# Patient Record
Sex: Female | Born: 1960 | Race: Black or African American | Hispanic: No | Marital: Single | Smoking: Never smoker
Health system: Southern US, Community
[De-identification: ages and names within clinical notes are randomized; demographics above are authoritative.]

## PROBLEM LIST (undated history)

## (undated) DIAGNOSIS — R7303 Prediabetes: Secondary | ICD-10-CM

## (undated) DIAGNOSIS — M169 Osteoarthritis of hip, unspecified: Secondary | ICD-10-CM

## (undated) HISTORY — DX: Prediabetes: R73.03

## (undated) HISTORY — DX: Osteoarthritis of hip, unspecified: M16.9

---

## 1998-04-15 ENCOUNTER — Other Ambulatory Visit: Admission: RE | Admit: 1998-04-15 | Discharge: 1998-04-15 | Payer: Self-pay | Admitting: *Deleted

## 1999-07-05 ENCOUNTER — Other Ambulatory Visit: Admission: RE | Admit: 1999-07-05 | Discharge: 1999-07-05 | Payer: Self-pay | Admitting: *Deleted

## 2000-08-23 ENCOUNTER — Other Ambulatory Visit: Admission: RE | Admit: 2000-08-23 | Discharge: 2000-08-23 | Payer: Self-pay | Admitting: *Deleted

## 2003-10-07 ENCOUNTER — Encounter: Admission: RE | Admit: 2003-10-07 | Discharge: 2003-10-07 | Payer: Self-pay | Admitting: Obstetrics and Gynecology

## 2004-11-29 ENCOUNTER — Encounter: Admission: RE | Admit: 2004-11-29 | Discharge: 2004-11-29 | Payer: Self-pay | Admitting: Obstetrics and Gynecology

## 2005-11-30 ENCOUNTER — Encounter: Admission: RE | Admit: 2005-11-30 | Discharge: 2005-11-30 | Payer: Self-pay | Admitting: Obstetrics and Gynecology

## 2006-12-05 ENCOUNTER — Encounter: Admission: RE | Admit: 2006-12-05 | Discharge: 2006-12-05 | Payer: Self-pay | Admitting: Obstetrics and Gynecology

## 2007-12-06 ENCOUNTER — Encounter: Admission: RE | Admit: 2007-12-06 | Discharge: 2007-12-06 | Payer: Self-pay | Admitting: Obstetrics and Gynecology

## 2008-12-07 ENCOUNTER — Encounter: Admission: RE | Admit: 2008-12-07 | Discharge: 2008-12-07 | Payer: Self-pay | Admitting: Obstetrics and Gynecology

## 2009-12-08 ENCOUNTER — Encounter
Admission: RE | Admit: 2009-12-08 | Discharge: 2009-12-08 | Payer: Self-pay | Source: Home / Self Care | Admitting: Obstetrics and Gynecology

## 2010-11-08 ENCOUNTER — Other Ambulatory Visit: Payer: Self-pay | Admitting: Obstetrics and Gynecology

## 2010-11-08 DIAGNOSIS — Z1231 Encounter for screening mammogram for malignant neoplasm of breast: Secondary | ICD-10-CM

## 2010-12-12 ENCOUNTER — Ambulatory Visit
Admission: RE | Admit: 2010-12-12 | Discharge: 2010-12-12 | Disposition: A | Discharge status: Home / Self Care | Payer: 59 | Source: Ambulatory Visit | Attending: Obstetrics and Gynecology | Admitting: Obstetrics and Gynecology

## 2010-12-12 DIAGNOSIS — Z1231 Encounter for screening mammogram for malignant neoplasm of breast: Secondary | ICD-10-CM

## 2011-09-27 ENCOUNTER — Other Ambulatory Visit: Payer: Self-pay | Admitting: Obstetrics and Gynecology

## 2011-09-27 DIAGNOSIS — Z1231 Encounter for screening mammogram for malignant neoplasm of breast: Secondary | ICD-10-CM

## 2011-12-15 ENCOUNTER — Ambulatory Visit: Payer: 59

## 2011-12-15 ENCOUNTER — Ambulatory Visit
Admission: RE | Admit: 2011-12-15 | Discharge: 2011-12-15 | Disposition: A | Discharge status: Home / Self Care | Payer: 59 | Source: Ambulatory Visit | Attending: Obstetrics and Gynecology | Admitting: Obstetrics and Gynecology

## 2011-12-15 DIAGNOSIS — Z1231 Encounter for screening mammogram for malignant neoplasm of breast: Secondary | ICD-10-CM

## 2012-11-07 ENCOUNTER — Other Ambulatory Visit: Payer: Self-pay

## 2012-11-07 DIAGNOSIS — Z1231 Encounter for screening mammogram for malignant neoplasm of breast: Secondary | ICD-10-CM

## 2012-12-16 ENCOUNTER — Ambulatory Visit: Payer: 59

## 2012-12-27 ENCOUNTER — Ambulatory Visit: Payer: 59

## 2012-12-31 ENCOUNTER — Ambulatory Visit
Admission: RE | Admit: 2012-12-31 | Discharge: 2012-12-31 | Disposition: A | Discharge status: Home / Self Care | Payer: 59 | Source: Ambulatory Visit

## 2012-12-31 DIAGNOSIS — Z1231 Encounter for screening mammogram for malignant neoplasm of breast: Secondary | ICD-10-CM

## 2013-11-09 ENCOUNTER — Ambulatory Visit (INDEPENDENT_AMBULATORY_CARE_PROVIDER_SITE_OTHER): Payer: 59

## 2013-11-09 ENCOUNTER — Ambulatory Visit (INDEPENDENT_AMBULATORY_CARE_PROVIDER_SITE_OTHER): Payer: 59 | Admitting: Family Medicine

## 2013-11-09 VITALS — BP 134/78 | HR 67 | Temp 98.4°F | Resp 16 | Ht 64.0 in | Wt 168.0 lb

## 2013-11-09 DIAGNOSIS — M25551 Pain in right hip: Secondary | ICD-10-CM

## 2013-11-09 DIAGNOSIS — M545 Low back pain: Secondary | ICD-10-CM

## 2013-11-09 DIAGNOSIS — M1611 Unilateral primary osteoarthritis, right hip: Secondary | ICD-10-CM

## 2013-11-09 MED ORDER — MELOXICAM 7.5 MG PO TABS: 7.5000 mg | ORAL_TABLET | Freq: Every day | ORAL | Status: DC

## 2013-11-09 NOTE — Patient Instructions (Signed)
Try the exercises and other information in the back care manual, meloxicam once or twice during the day as needed (avoid taking other NSAIDS like Alleve or Ibuprofen while taking this). I will refer you to orthopaedic doctor as discussed. Return to the clinic or go to the nearest emergency room if any of your symptoms worsen or new symptoms occur.  Hip Pain Your hip is the joint between your upper legs and your lower pelvis. The bones, cartilage, tendons, and muscles of your hip joint perform a lot of work each day supporting your body weight and allowing you to move around. Hip pain can range from a minor ache to severe pain in one or both of your hips. Pain may be felt on the inside of the hip joint near the groin, or the outside near the buttocks and upper thigh. You may have swelling or stiffness as well.  HOME CARE INSTRUCTIONS   Take medicines only as directed by your health care provider.  Apply ice to the injured area:  Put ice in a plastic bag.  Place a towel between your skin and the bag.  Leave the ice on for 15-20 minutes at a time, 3-4 times a day.  Keep your leg raised (elevated) when possible to lessen swelling.  Avoid activities that cause pain.  Follow specific exercises as directed by your health care provider.  Sleep with a pillow between your legs on your most comfortable side.  Record how often you have hip pain, the location of the pain, and what it feels like. SEEK MEDICAL CARE IF:   You are unable to put weight on your leg.  Your hip is red or swollen or very tender to touch.  Your pain or swelling continues or worsens after 1 week.  You have increasing difficulty walking.  You have a fever. SEEK IMMEDIATE MEDICAL CARE IF:   You have fallen.  You have a sudden increase in pain and swelling in your hip. MAKE SURE YOU:   Understand these instructions.  Will watch your condition.  Will get help right away if you are not doing well or get  worse. Document Released: 06/08/2009 Document Revised: 05/05/2013 Document Reviewed: 08/15/2012 [facility] Patient Information 2015 [place], [place]. This information is not intended to replace advice given to you by your health care provider. Make sure you discuss any questions you have with your health care provider.

## 2013-11-09 NOTE — Progress Notes (Addendum)
Subjective:    Patient ID: Linda Wagner, female    DOB: [birth year], 53 y.o.   MRN: [id number]  HPI Chief Complaint  Patient presents with  . Leg Pain    right   This chart was scribed for Ferry Matthis, MD by Andrew Au, ED Scribe. This patient was seen in room 12 and the patient's care was started at 11:27 AM.  HPI Comments: Linda Wagner is a 53 y.o. female who presents to the [facility] complaining of right leg pain that began few months ago. Pt reports pain was initially intermittent but reports worsening more frequent intermittent pain within the last week. She reports pain with bearing weight, some tingling to right leg, and weakness where her right leg given out at times. She reports associated intermittent, non radiating low back pain that she uses a heating pad for as neded. Pt has taken aleve for pain as need with minimal relief to leg pain. Pt reports more exercise this week with walking and workout machinery early this week. Pt denies bladder and bowel incont  There are no active problems to display for this patient.  History reviewed. No pertinent past medical history. History reviewed. No pertinent past surgical history. No Known Allergies Prior to Admission medications   Not on File   History   Social History  . Marital Status: Single    Spouse Name: N/A    Number of Children: N/A  . Years of Education: N/A   Occupational History  . Not on file.   Social History Main Topics  . Smoking status: Never Smoker   . Smokeless tobacco: Not on file  . Alcohol Use: Not on file  . Drug Use: Not on file  . Sexual Activity: Not on file   Other Topics Concern  . Not on file   Social History Narrative  . No narrative on file   Review of Systems  Musculoskeletal: Positive for myalgias and arthralgias.  Neurological: Positive for weakness.   Objective:   Physical Exam  Constitutional: She is oriented to person, place, and time. She  appears well-developed and well-nourished. No distress.  HENT:  Head: Normocephalic and atraumatic.  Eyes: Conjunctivae and EOM are normal.  Neck: Neck supple.  Cardiovascular: Normal rate.   Pulmonary/Chest: Effort normal.  Musculoskeletal: Normal range of motion.  L-spine- Full ROM with slight tenderness. She is able to heal- toe without difficulty . Right hip- Crepitance and discomfort with external and internal rotation. positive C sign with description of area of discomfort at right hip. Slight decrease internal and external rotation with reproduction of pain with both. Positive FADDIR positive FABER    Neurological: She is alert and oriented to person, place, and time. No cranial nerve deficit. She displays no Babinski's sign on the right side. She displays no Babinski's sign on the left side.  Reflex Scores:      Patellar reflexes are 2+ on the right side and 2+ on the left side.      Achilles reflexes are 2+ on the right side and 2+ on the left side. Skin: Skin is warm and dry.  Psychiatric: She has a normal mood and affect. Her behavior is normal.  Nursing note and vitals reviewed.  Filed Vitals:   11/09/13 1101  BP: 134/78  Pulse: 67  Temp: 98.4 F (36.9 C)  TempSrc: Oral  Resp: 16  Height: 5\' 4"  (1.626 m)  Weight: 168 lb (76.204 kg)  SpO2: 99%  [facility] reading (PRIMARY) by Dr. Neva SeatGreene. Right hip- Degenerative changes with bony prominence on superior acetabulum and questionable cam lesion or bony prominence on femoral head. LS-spine- no acute findings. Slight decrease space between L1-L2      Assessment & Plan:   Linda Wagner is a 53 y.o. female Right hip pain - Plan: DG Hip Complete Right, meloxicam (MOBIC) 7.5 MG tablet, Ambulatory referral to Orthopedic Surgery  Low back pain, unspecified back pain laterality, with sciatica presence unspecified - Plan: DG Lumbar Spine 2-3 Views, meloxicam (MOBIC) 7.5 MG tablet  Osteoarthritis of right hip, unspecified  osteoarthritis type - Plan: meloxicam (MOBIC) 7.5 MG tablet, Ambulatory referral to Orthopedic Surgery  R hip pain - probable OA, but DDX of femoral acetabular impingement/FAI. Some DDD of LS spine, but does not appear to be primary cause of hip symptoms. Can start with Mobic and refer to ortho.  XR of hip report noted: . Degenerative osteoarthritis of the right hip including buttressing of the medial aspect of the femoral neck, narrowing of the superolateral joint space and early osteophyte formation at the lateral acetabulum. 2. No acute osseous abnormality or malalignment  Meds ordered this encounter  Medications  . meloxicam (MOBIC) 7.5 MG tablet    Sig: Take 1 tablet (7.5 mg total) by mouth daily.    Dispense:  30 tablet    Refill:  0   Patient Instructions  Try the exercises and other information in the back care manual, meloxicam once or twice during the day as needed (avoid taking other NSAIDS like Alleve or Ibuprofen while taking this). I will refer you to orthopaedic doctor as discussed. Return to the clinic or go to the nearest emergency room if any of your symptoms worsen or new symptoms occur.  Hip Pain Your hip is the joint between your upper legs and your lower pelvis. The bones, cartilage, tendons, and muscles of your hip joint perform a lot of work each day supporting your body weight and allowing you to move around. Hip pain can range from a minor ache to severe pain in one or both of your hips. Pain may be felt on the inside of the hip joint near the groin, or the outside near the buttocks and upper thigh. You may have swelling or stiffness as well.  HOME CARE INSTRUCTIONS   Take medicines only as directed by your health care provider.  Apply ice to the injured area:  Put ice in a plastic bag.  Place a towel between your skin and the bag.  Leave the ice on for 15-20 minutes at a time, 3-4 times a day.  Keep your leg raised (elevated) when possible to lessen  swelling.  Avoid activities that cause pain.  Follow specific exercises as directed by your health care provider.  Sleep with a pillow between your legs on your most comfortable side.  Record how often you have hip pain, the location of the pain, and what it feels like. SEEK MEDICAL CARE IF:   You are unable to put weight on your leg.  Your hip is red or swollen or very tender to touch.  Your pain or swelling continues or worsens after 1 week.  You have increasing difficulty walking.  You have a fever. SEEK IMMEDIATE MEDICAL CARE IF:   You have fallen.  You have a sudden increase in pain and swelling in your hip. MAKE SURE YOU:   Understand these instructions.  Will watch your condition.  Will get help  right away if you are not doing well or get worse. Document Released: 06/08/2009 Document Revised: 05/05/2013 Document Reviewed: 08/15/2012 [facility] Patient Information 2015 [place], [place]. This information is not intended to replace advice given to you by your health care provider. Make sure you discuss any questions you have with your health care provider.       I personally performed the services described in this documentation, which was scribed in my presence. The recorded information has been reviewed and considered, and addended by me as needed.

## 2014-01-14 ENCOUNTER — Other Ambulatory Visit: Payer: Self-pay

## 2014-01-14 DIAGNOSIS — Z1231 Encounter for screening mammogram for malignant neoplasm of breast: Secondary | ICD-10-CM

## 2014-01-16 ENCOUNTER — Ambulatory Visit
Admission: RE | Admit: 2014-01-16 | Discharge: 2014-01-16 | Disposition: A | Discharge status: Home / Self Care | Payer: BLUE CROSS/BLUE SHIELD | Source: Ambulatory Visit

## 2014-01-16 DIAGNOSIS — Z1231 Encounter for screening mammogram for malignant neoplasm of breast: Secondary | ICD-10-CM

## 2015-01-06 ENCOUNTER — Other Ambulatory Visit: Payer: Self-pay

## 2015-01-06 DIAGNOSIS — Z1231 Encounter for screening mammogram for malignant neoplasm of breast: Secondary | ICD-10-CM

## 2015-01-19 ENCOUNTER — Ambulatory Visit
Admission: RE | Admit: 2015-01-19 | Discharge: 2015-01-19 | Disposition: A | Discharge status: Home / Self Care | Payer: BLUE CROSS/BLUE SHIELD | Source: Ambulatory Visit

## 2015-01-19 DIAGNOSIS — Z1231 Encounter for screening mammogram for malignant neoplasm of breast: Secondary | ICD-10-CM

## 2015-12-15 IMAGING — CR DG HIP COMPLETE 2+V*R*
2 series · 2 of 2 positions shown · non-contrast
Comparison: Concurrently obtained radiographs of the lumbar spine.

CLINICAL DATA: 53-year-old female with several month history
intermittent right leg pain, weakness and tingling.

EXAM:
RIGHT HIP - COMPLETE 2+ VIEW

[AP]
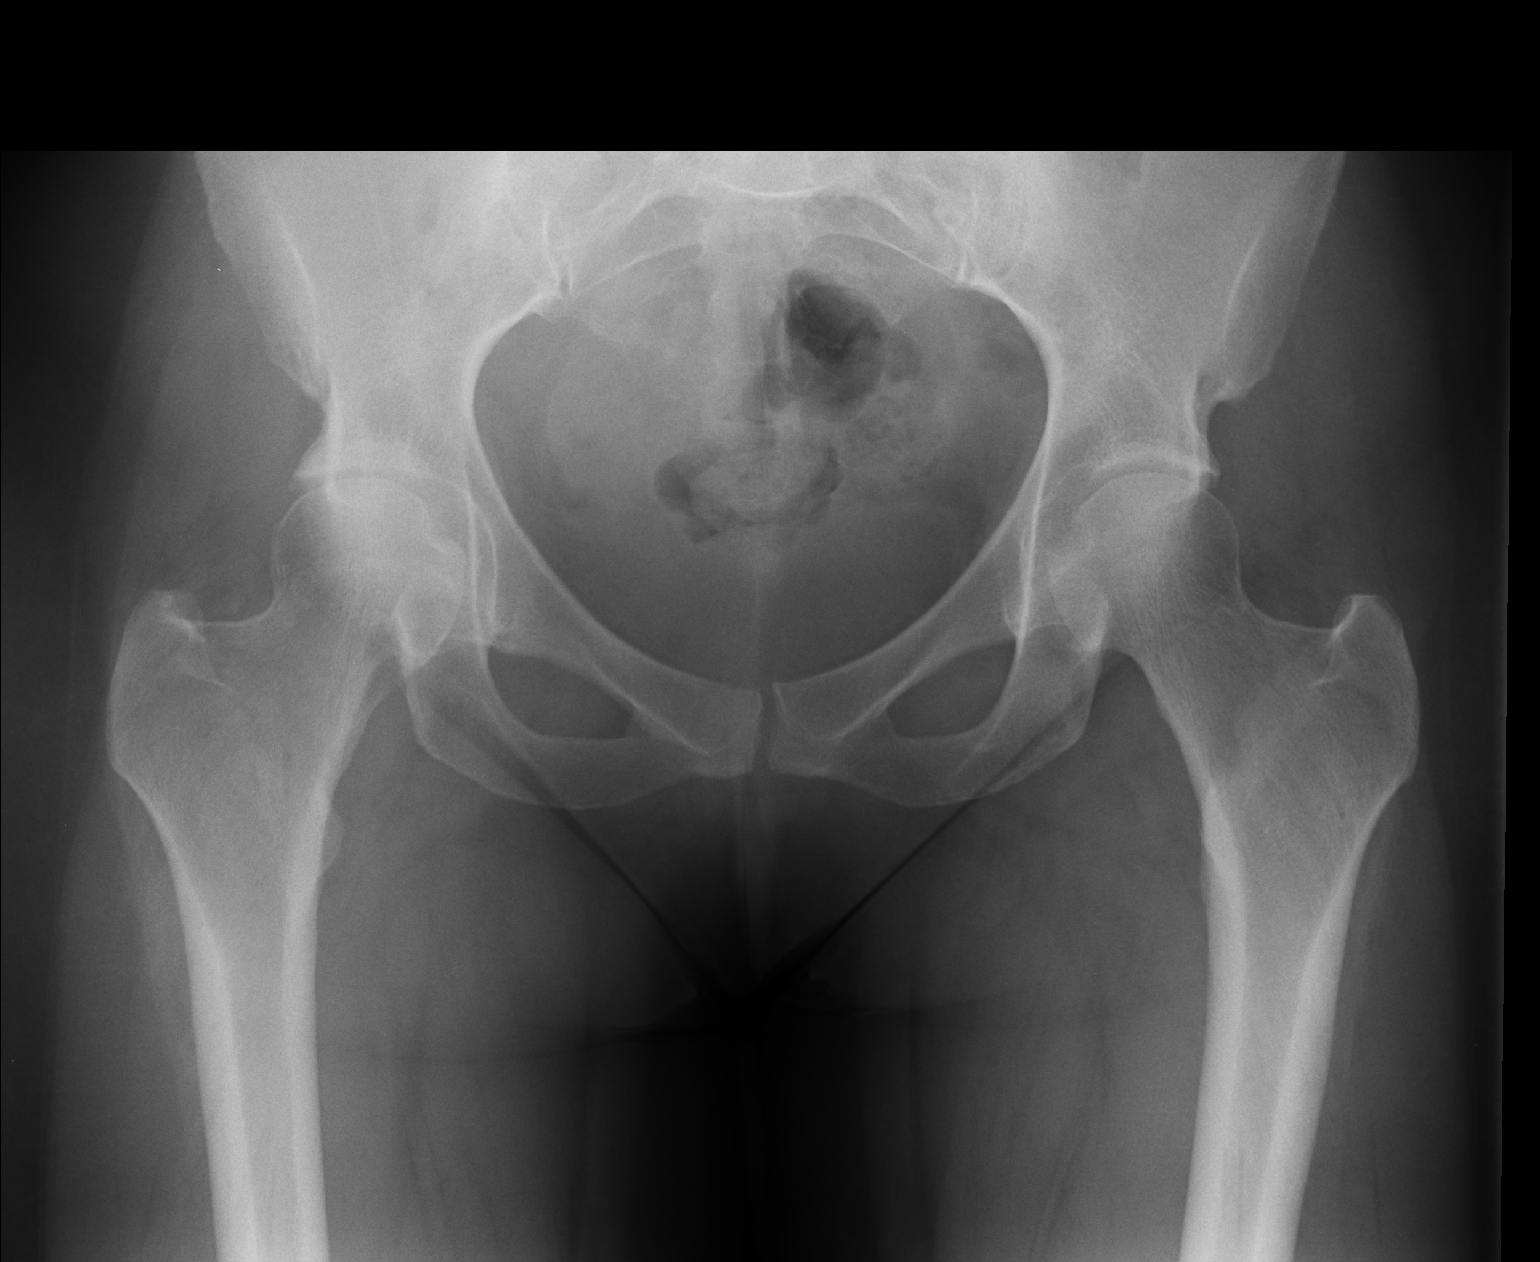

[lateral]
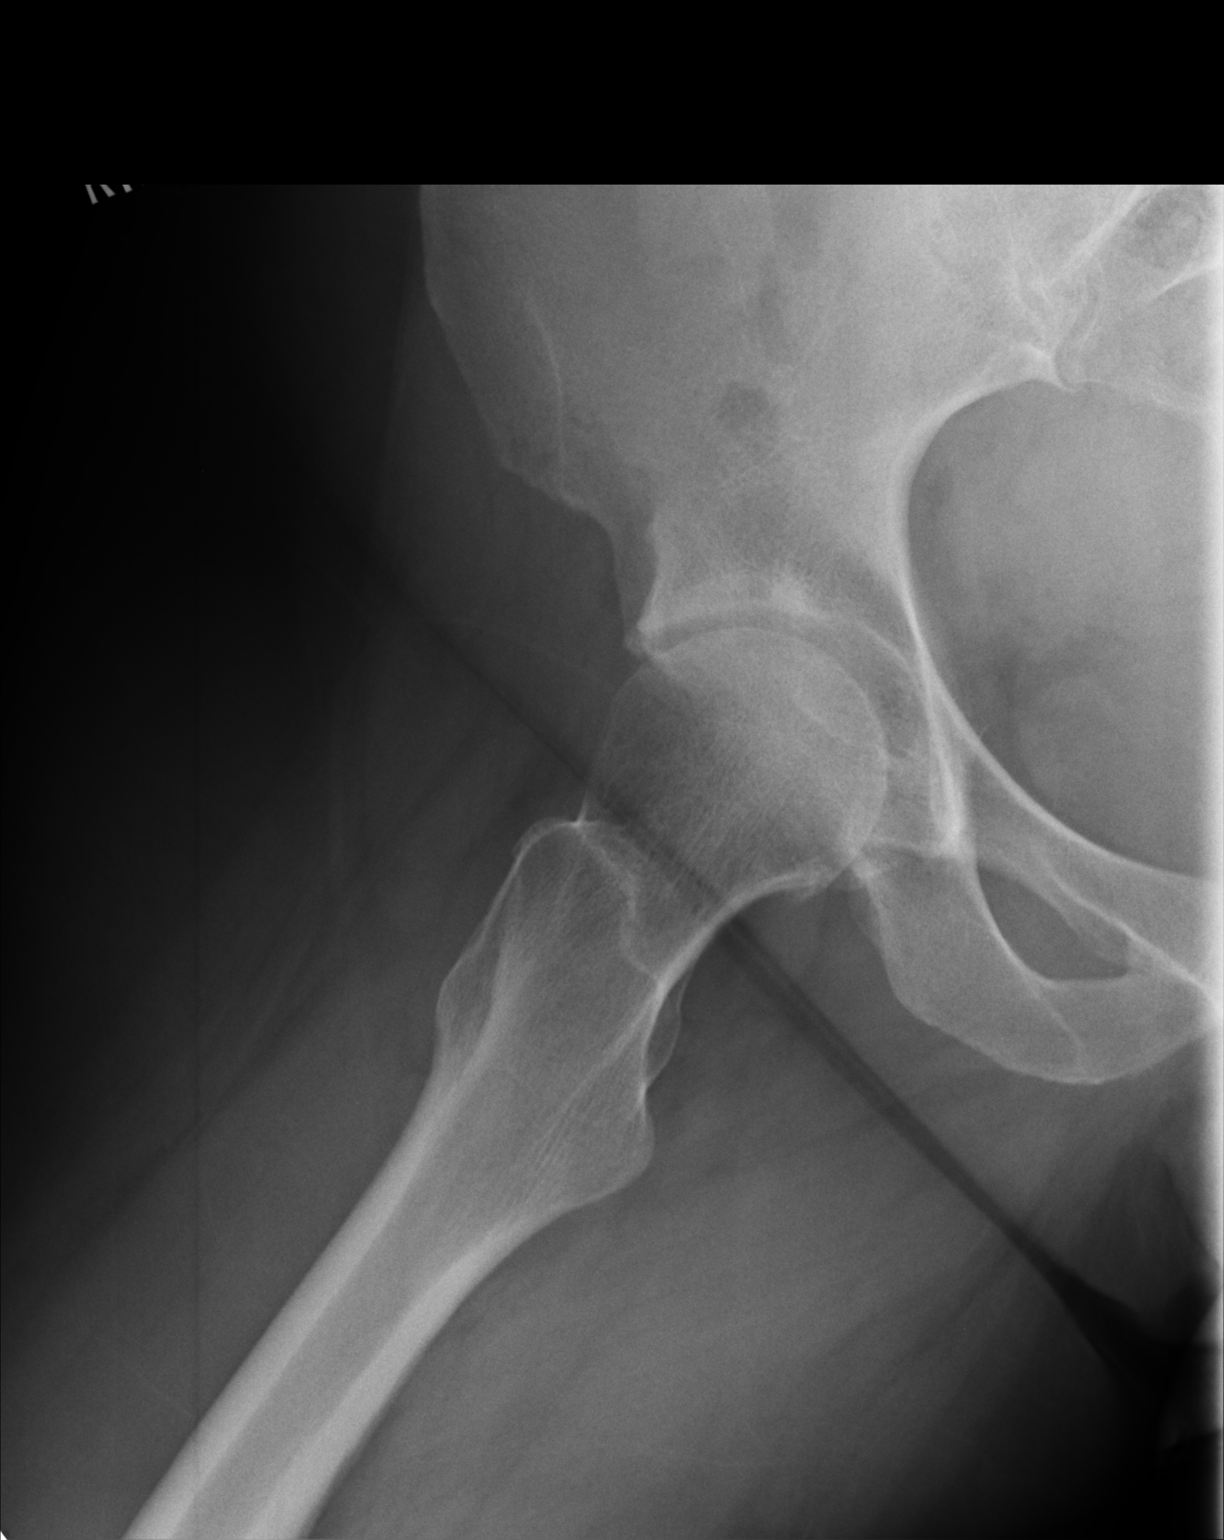

[2 of 2 positions shown; findings below may reference images not displayed]

FINDINGS: Mild degenerative osteoarthritis of the right hip joint with
narrowing of the superolateral joint space, early sclerosis an
osteophyte formation at the lateral acetabulum. Additionally, there
is slight buttressing of the medial femoral neck on the frontal view
which is not evident on the lateral view. The visualized bony pelvis
is intact and unremarkable. The left hip is within normal limits.
IMPRESSION: 1. Degenerative osteoarthritis of the right hip including
buttressing of the medial aspect of the femoral neck, narrowing of
the superolateral joint space and early osteophyte formation at the
lateral acetabulum.
2. No acute osseous abnormality or malalignment.

## 2016-10-31 LAB — LIPID PANEL
Cholesterol: 206 — AB (ref 0–200)
HDL: 70 (ref 35–70)
LDL Cholesterol: 124
LDl/HDL Ratio: 1.8
Triglycerides: 60 (ref 40–160)

## 2016-10-31 LAB — BASIC METABOLIC PANEL
BUN: 13 (ref 4–21)
Creatinine: 0.9 (ref 0.5–1.1)
Glucose: 76
Potassium: 4 (ref 3.4–5.3)
Sodium: 134 — AB (ref 137–147)

## 2016-10-31 LAB — HEPATIC FUNCTION PANEL
ALT: 18 (ref 7–35)
AST: 32 (ref 13–35)
Alkaline Phosphatase: 86 (ref 25–125)
Bilirubin, Total: 0.6

## 2016-10-31 LAB — VITAMIN D 25 HYDROXY (VIT D DEFICIENCY, FRACTURES): Vit D, 25-Hydroxy: 53.5

## 2016-10-31 LAB — CBC AND DIFFERENTIAL
HCT: 38 (ref 36–46)
Hemoglobin: 12.6 (ref 12.0–16.0)
Platelets: 206 (ref 150–399)
WBC: 5.1

## 2016-10-31 LAB — HEMOGLOBIN A1C: Hemoglobin A1C: 6.8

## 2017-02-23 IMAGING — MG MM SCREENING BREAST TOMO BILATERAL
8 series · 8 of 24 positions shown · non-contrast
Comparison: Previous exam(s).

CLINICAL DATA: Screening.

EXAM:
DIGITAL SCREENING BILATERAL MAMMOGRAM WITH 3D TOMO WITH CAD

[L CC]
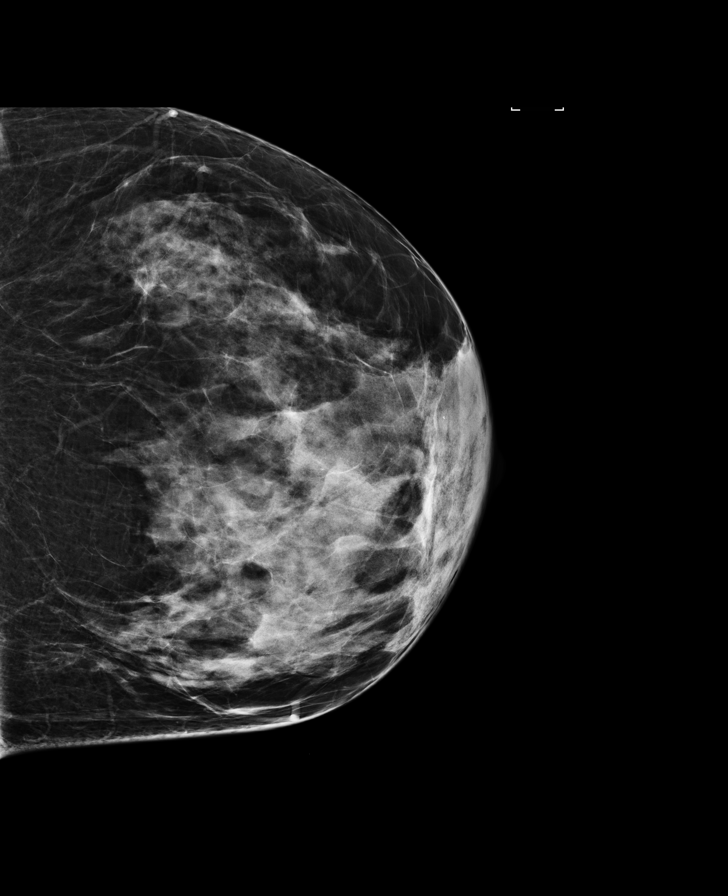

[R MLO]
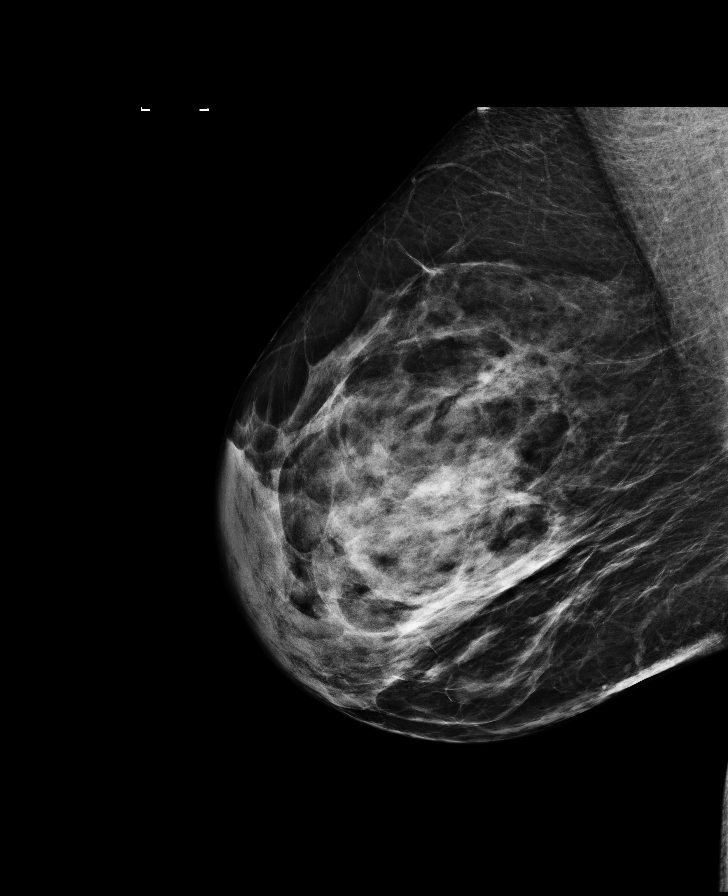

[L MLO]
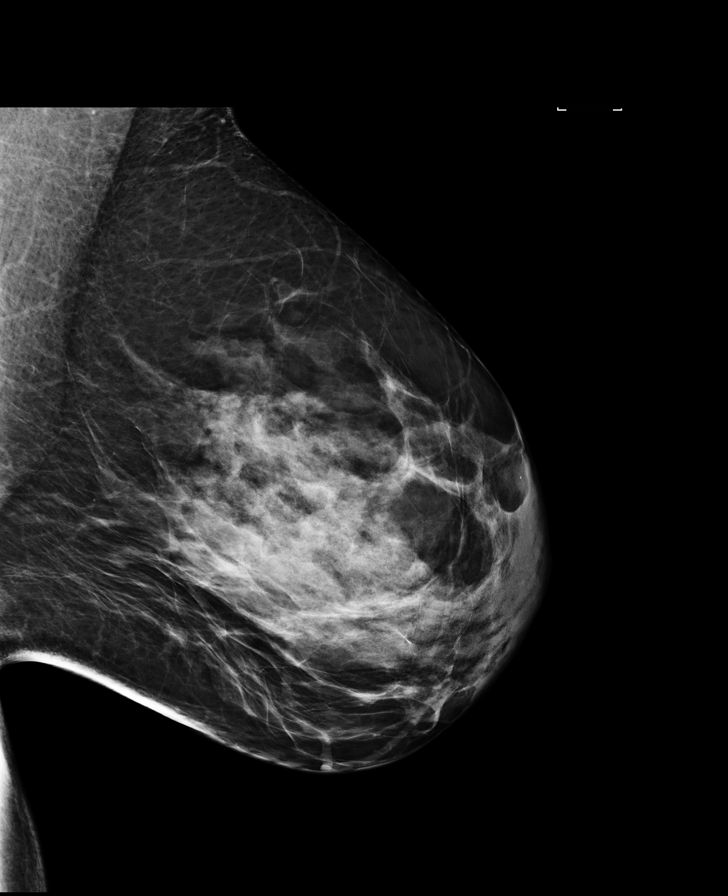

[R CC]
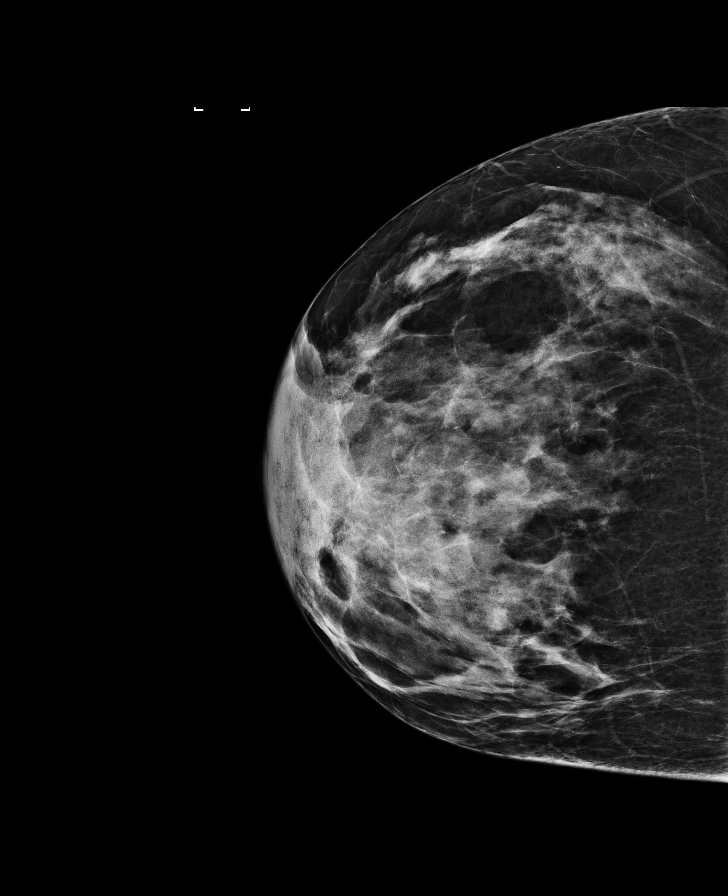

[L CC tomo · tomo slice 37/74.0]
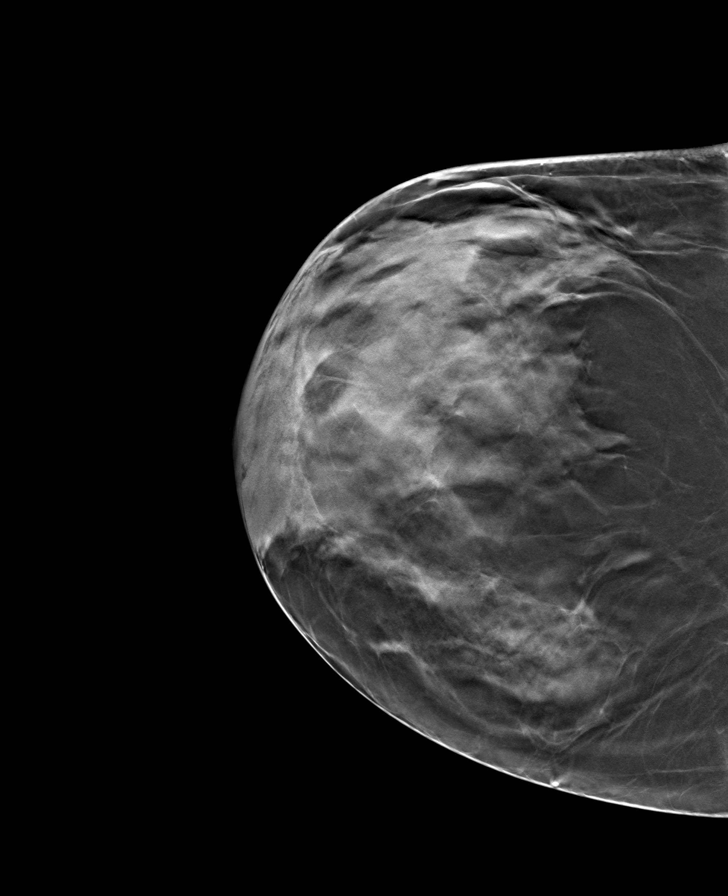

[R CC tomo · tomo slice 38/75.0]
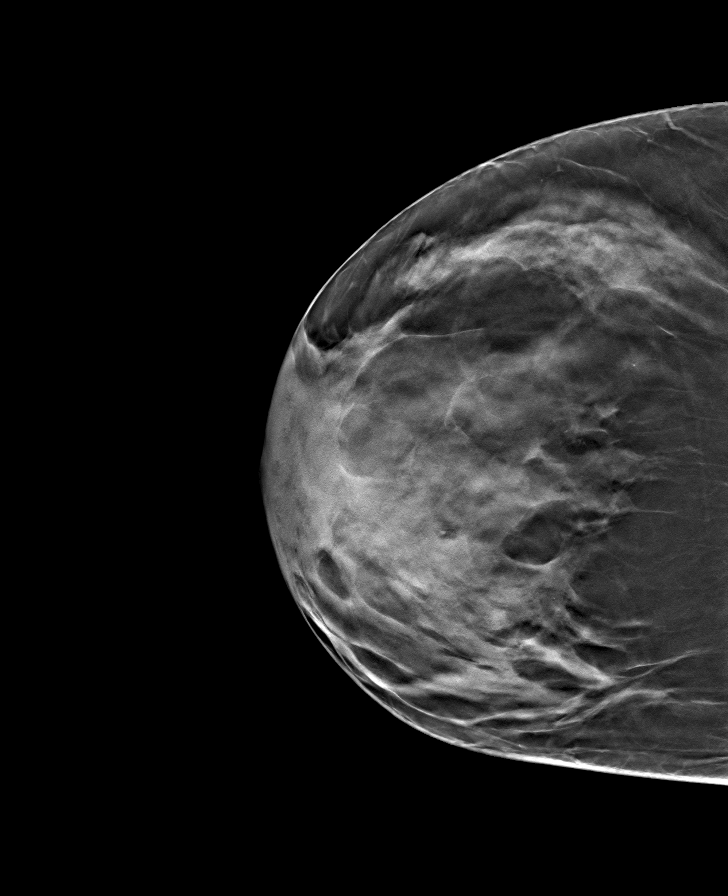

[L MLO tomo · tomo slice 45/90.0]
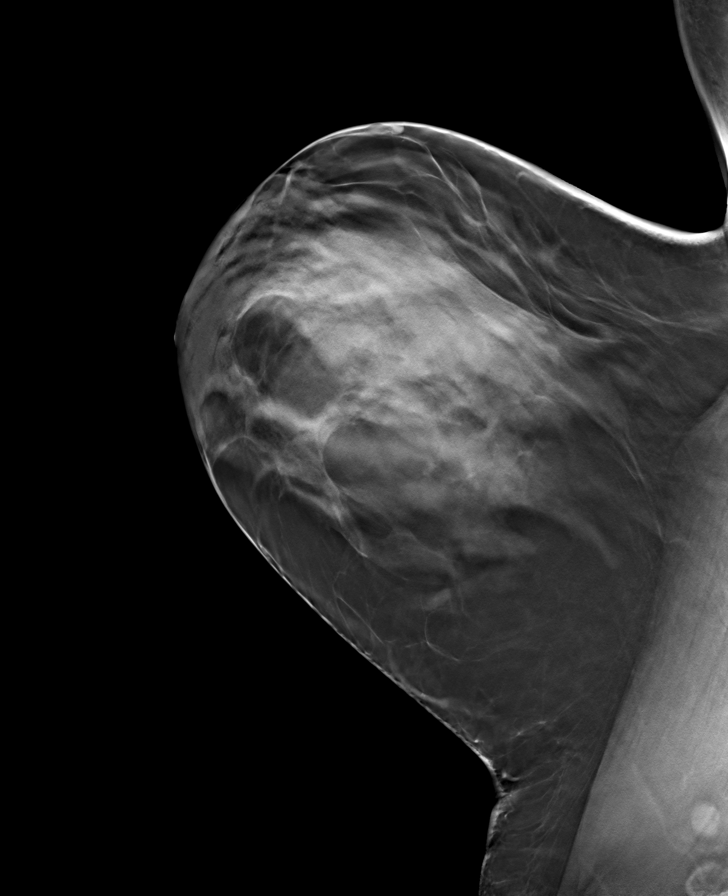

[R MLO tomo · tomo slice 45/89.0]
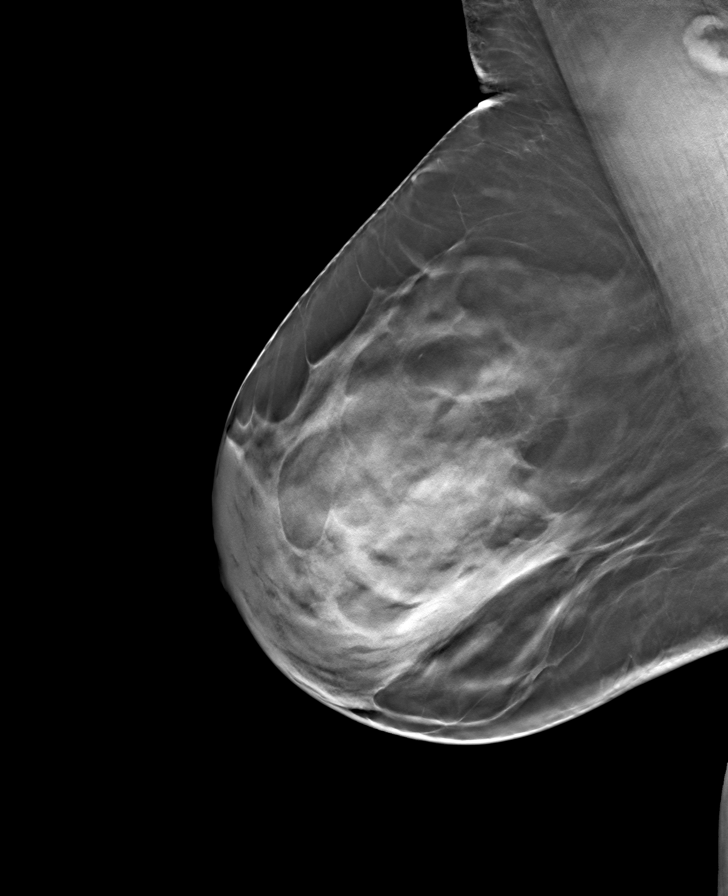

[8 of 24 positions shown; findings below may reference images not displayed]

ACR Breast Density Category c: The breast tissue is heterogeneously
dense, which may obscure small masses.
FINDINGS: There are no findings suspicious for malignancy. Images were
processed with CAD.
IMPRESSION: No mammographic evidence of malignancy. A result letter of this
screening mammogram will be mailed directly to the patient.

RECOMMENDATION:
Screening mammogram in one year. (Code:OA-G-1SS)

BI-RADS CATEGORY  1: Negative.

## 2017-05-02 LAB — HEMOGLOBIN A1C: Hemoglobin A1C: 6.8

## 2017-05-02 LAB — BASIC METABOLIC PANEL
BUN: 16 (ref 4–21)
Creatinine: 0.8 (ref 0.5–1.1)
Glucose: 90
Potassium: 4.7 (ref 3.4–5.3)
Sodium: 139 (ref 137–147)

## 2017-09-27 LAB — HM DIABETES EYE EXAM

## 2017-12-13 ENCOUNTER — Ambulatory Visit (INDEPENDENT_AMBULATORY_CARE_PROVIDER_SITE_OTHER): Payer: BLUE CROSS/BLUE SHIELD | Admitting: Internal Medicine

## 2017-12-13 ENCOUNTER — Encounter: Payer: Self-pay | Admitting: Internal Medicine

## 2017-12-13 VITALS — BP 122/86 | HR 46 | Temp 98.7°F | Ht 61.5 in | Wt 147.8 lb

## 2017-12-13 DIAGNOSIS — Z0000 Encounter for general adult medical examination without abnormal findings: Secondary | ICD-10-CM | POA: Diagnosis not present

## 2017-12-13 LAB — POCT URINALYSIS DIPSTICK
Bilirubin, UA: NEGATIVE
Glucose, UA: NEGATIVE
Ketones, UA: NEGATIVE
Leukocytes, UA: NEGATIVE
Nitrite, UA: NEGATIVE
Protein, UA: NEGATIVE
Spec Grav, UA: <=1.005 — AB (ref 1.010–1.025)
Urobilinogen, UA: 0.2 E.U./dL
pH, UA: 6.5 (ref 5.0–8.0)

## 2017-12-13 NOTE — Addendum Note (Signed)
Addended by: Mariam DollarWIGGINS, Dalis Beers on: 12/13/2017 12:55 PM   Modules accepted: Orders

## 2017-12-13 NOTE — Patient Instructions (Signed)

## 2017-12-13 NOTE — Progress Notes (Signed)
Subjective:     Patient ID: Bufford Lope , female    DOB: 1960/11/30 , 57 y.o.   MRN: [id number]   Chief Complaint  Patient presents with  . Annual Exam    HPI  She is here today for a full physical exam. She is followed by Dr. Philis Pique for her gyn exams.     History reviewed. No pertinent past medical history.   Family History  Problem Relation Age of Onset  . Diabetes Mother   . Diabetes Father   . Dementia Father      Current Outpatient Medications:  .  flurandrenolide (CORDRAN) 0.05 % ointment, Cordran 0.05 % topical ointment, Disp: , Rfl:    No Known Allergies   Review of Systems  Constitutional: Negative.   HENT: Negative.   Eyes: Negative.   Respiratory: Negative.   Cardiovascular: Negative.   Gastrointestinal: Negative.   Endocrine: Negative.   Genitourinary: Negative.   Musculoskeletal: Negative.   Skin: Negative.   Allergic/Immunologic: Negative.   Neurological: Negative.   Hematological: Negative.   Psychiatric/Behavioral: Negative.      Today's Vitals   12/13/17 0912  BP: 122/86  Pulse: (!) 46  Temp: 98.7 F (37.1 C)  TempSrc: Oral  Weight: 147 lb 12.5 oz (67 kg)  Height: 5' 1.5" (1.562 m)   Body mass index is 27.47 kg/m.   Objective:  Physical Exam Vitals signs and nursing note reviewed. Exam conducted with a chaperone present.  Constitutional:      Appearance: Normal appearance.  HENT:     Head: Normocephalic and atraumatic.     Right Ear: Tympanic membrane, ear canal and external ear normal.     Left Ear: Tympanic membrane, ear canal and external ear normal.     Nose: Nose normal.     Mouth/Throat:     Mouth: Mucous membranes are moist.     Pharynx: Oropharynx is clear.  Eyes:     Extraocular Movements: Extraocular movements intact.     Pupils: Pupils are equal, round, and reactive to light.  Neck:     Musculoskeletal: Normal range of motion and neck supple.  Cardiovascular:     Rate and Rhythm: Regular rhythm.  Bradycardia present.     Pulses: Normal pulses.     Heart sounds: Normal heart sounds.  Pulmonary:     Effort: Pulmonary effort is normal.     Breath sounds: Normal breath sounds.  Chest:     Breasts:        Right: Normal. No swelling, bleeding, inverted nipple, mass, nipple discharge or skin change.        Left: Normal. No swelling, bleeding, inverted nipple, mass, nipple discharge or skin change.  Abdominal:     General: Abdomen is flat. Bowel sounds are normal.     Palpations: Abdomen is soft.  Genitourinary:    Comments: deferred Musculoskeletal: Normal range of motion.  Skin:    General: Skin is warm and dry.  Neurological:     General: No focal deficit present.     Mental Status: She is alert and oriented to person, place, and time.  Psychiatric:        Mood and Affect: Mood normal.         Assessment And Plan:     1. Routine general medical examination at health care facility  A full exam was performed.  Importance of monthly self breast exams was discussed with the patient.  She was also congratulated on her 48  pound weight loss since her last visit. She is encouraged to keep up the great work.  PATIENT HAS BEEN ADVISED TO GET 30-45 MINUTES REGULAR EXERCISE NO LESS THAN FOUR TO FIVE DAYS PER WEEK - BOTH WEIGHTBEARING EXERCISES AND AEROBIC ARE RECOMMENDED.  SHE IS ADVISED TO FOLLOW A HEALTHY DIET WITH AT LEAST SIX FRUITS/VEGGIES PER DAY, DECREASE INTAKE OF RED MEAT, AND TO INCREASE FISH INTAKE TO TWO DAYS PER WEEK.  MEATS/FISH SHOULD NOT BE FRIED, BAKED OR BROILED IS PREFERABLE.  I SUGGEST WEARING SPF 50 SUNSCREEN ON EXPOSED PARTS AND ESPECIALLY WHEN IN THE DIRECT SUNLIGHT FOR AN EXTENDED PERIOD OF TIME.  PLEASE AVOID FAST FOOD RESTAURANTS AND INCREASE YOUR WATER INTAKE.  - CMP14+EGFR - CBC - Lipid panel - Hemoglobin A1c - TSH        Maximino Greenland, MD

## 2017-12-14 LAB — CMP14+EGFR
ALT: 26 IU/L (ref 0–32)
AST: 32 IU/L (ref 0–40)
Albumin/Globulin Ratio: 1.5 (ref 1.2–2.2)
Albumin: 4.5 g/dL (ref 3.5–5.5)
Alkaline Phosphatase: 78 IU/L (ref 39–117)
BUN/Creatinine Ratio: 14 (ref 9–23)
BUN: 10 mg/dL (ref 6–24)
Bilirubin Total: 0.4 mg/dL (ref 0.0–1.2)
CO2: 25 mmol/L (ref 20–29)
Calcium: 9.6 mg/dL (ref 8.7–10.2)
Chloride: 102 mmol/L (ref 96–106)
Creatinine, Ser: 0.73 mg/dL (ref 0.57–1.00)
GFR calc Af Amer: 104 mL/min/{1.73_m2} (ref >59)
GFR calc non Af Amer: 90 mL/min/{1.73_m2} (ref >59)
Globulin, Total: 3.1 g/dL (ref 1.5–4.5)
Glucose: 85 mg/dL (ref 65–99)
Potassium: 3.9 mmol/L (ref 3.5–5.2)
Sodium: 140 mmol/L (ref 134–144)
Total Protein: 7.6 g/dL (ref 6.0–8.5)

## 2017-12-14 LAB — CBC
Hematocrit: 36.4 % (ref 34.0–46.6)
Hemoglobin: 11.9 g/dL (ref 11.1–15.9)
MCH: 22.6 pg — ABNORMAL LOW (ref 26.6–33.0)
MCHC: 32.7 g/dL (ref 31.5–35.7)
MCV: 69 fL — ABNORMAL LOW (ref 79–97)
Platelets: 192 10*3/uL (ref 150–450)
RBC: 5.27 x10E6/uL (ref 3.77–5.28)
RDW: 13.9 % (ref 12.3–15.4)
WBC: 3.9 10*3/uL (ref 3.4–10.8)

## 2017-12-14 LAB — LIPID PANEL
Chol/HDL Ratio: 2 ratio (ref 0.0–4.4)
Cholesterol, Total: 135 mg/dL (ref 100–199)
HDL: 68 mg/dL (ref >39)
LDL Calculated: 58 mg/dL (ref 0–99)
Triglycerides: 45 mg/dL (ref 0–149)
VLDL Cholesterol Cal: 9 mg/dL (ref 5–40)

## 2017-12-14 LAB — HEMOGLOBIN A1C
Est. average glucose Bld gHb Est-mCnc: 140 mg/dL
Hgb A1c MFr Bld: 6.5 % — ABNORMAL HIGH (ref 4.8–5.6)

## 2017-12-14 LAB — TSH: TSH: 1.45 u[IU]/mL (ref 0.450–4.500)

## 2017-12-15 NOTE — Progress Notes (Signed)
Here are your lab results:  Your liber and kidney function are normal. Your blood count is normal, but on the low end. Do you take a multivitamin? If not, I suggest you start one daily. Your cholesterol is great. Your hba1c has improved, it is down to 6.5. This is in the diabetes range. Normal is less than 5.6. Congratulations on your improvement and keep up the great work.   Lastly, your thyroid function is normal. Happy holidays! I look forward to seeing you in the New Year.   Sincerely,    Ahamed Hofland N. Allyne GeeSanders, MD

## 2017-12-20 ENCOUNTER — Encounter: Payer: Self-pay | Admitting: Internal Medicine

## 2017-12-31 ENCOUNTER — Encounter: Payer: Self-pay | Admitting: Internal Medicine

## 2018-04-15 ENCOUNTER — Ambulatory Visit: Payer: Self-pay | Admitting: Internal Medicine

## 2018-06-11 LAB — HM PAP SMEAR

## 2018-07-15 ENCOUNTER — Encounter: Payer: Self-pay | Admitting: Internal Medicine

## 2018-10-11 ENCOUNTER — Other Ambulatory Visit: Payer: Self-pay | Admitting: Orthopaedic Surgery

## 2018-10-17 ENCOUNTER — Encounter: Payer: Self-pay | Admitting: Nurse Practitioner

## 2018-10-17 ENCOUNTER — Ambulatory Visit (INDEPENDENT_AMBULATORY_CARE_PROVIDER_SITE_OTHER): Payer: BC Managed Care – PPO | Admitting: Nurse Practitioner

## 2018-10-17 ENCOUNTER — Other Ambulatory Visit: Payer: Self-pay

## 2018-10-17 VITALS — BP 122/78 | HR 49 | Temp 98.3°F | Ht 62.0 in | Wt 149.8 lb

## 2018-10-17 DIAGNOSIS — M25552 Pain in left hip: Secondary | ICD-10-CM

## 2018-10-17 DIAGNOSIS — R001 Bradycardia, unspecified: Secondary | ICD-10-CM | POA: Diagnosis not present

## 2018-10-17 DIAGNOSIS — Z01818 Encounter for other preprocedural examination: Secondary | ICD-10-CM

## 2018-10-17 NOTE — Progress Notes (Signed)
  Subjective:     Patient ID: Linda Wagner , female    DOB: [birth year] , 58 y.o.   MRN: [id number]   Chief Complaint  Patient presents with  . Pre-op Exam    patient is having a hip replacement on 11/12/18 with Dr.Darloff    HPI  She is to have right hip replacement on 11/12/2018 with Dr. Latanya Maudlin.  No additional symptoms.  Denies any cold symptoms.  Denies chest pain or shortness of breath.  She walks daily.  Denies dizziness.      No past medical history on file.   Family History  Problem Relation Age of Onset  . Diabetes Mother   . Diabetes Father   . Dementia Father      Current Outpatient Medications:  .  flurandrenolide (CORDRAN) 0.05 % ointment, Cordran 0.05 % topical ointment, Disp: , Rfl:    No Known Allergies   Review of Systems  Constitutional: Negative.   Respiratory: Negative.   Cardiovascular: Negative.  Negative for chest pain, palpitations and leg swelling.  Musculoskeletal: Positive for arthralgias (left hip pain).  Neurological: Negative for dizziness and headaches.  Psychiatric/Behavioral: Negative.      Today's Vitals   10/17/18 1421  BP: 122/78  Pulse: (!) 49  Temp: 98.3 F (36.8 C)  TempSrc: Oral  Weight: 149 lb 12.8 oz (67.9 kg)  Height: '5\' 2"'$  (1.575 m)  PainSc: 0-No pain   Body mass index is 27.4 kg/m.   Objective:  Physical Exam Constitutional:      Appearance: Normal appearance.  Cardiovascular:     Rate and Rhythm: Bradycardia present.     Pulses: Normal pulses.     Heart sounds: Normal heart sounds. No murmur.  Pulmonary:     Effort: Pulmonary effort is normal. No respiratory distress.     Breath sounds: Normal breath sounds.  Musculoskeletal:        General: No swelling or deformity.     Right lower leg: No edema.     Left lower leg: No edema.  Skin:    Capillary Refill: Capillary refill takes less than 2 seconds.  Neurological:     General: No focal deficit present.     Mental Status: She is alert.  Psychiatric:         Mood and Affect: Mood normal.        Behavior: Behavior normal.        Thought Content: Thought content normal.        Judgment: Judgment normal.         Assessment And Plan:     1. Bradycardia  Asymptomatic, EKG revealed bradycardia HR 51, no change from previous EKG.   Consulted with Dr. Baird Cancer who also agrees she does not warrant further evaluation for her bradycardia - TSH - EKG 12-Lead  2. Left hip pain  She is to have left hip replacement by Dr. Rhona Raider on 11/22/2018  She is cleared for orthopedic surgery with low risk - CMP14+EGFR - TSH - CBC   Minette Brine, FNP    THE PATIENT IS ENCOURAGED TO PRACTICE SOCIAL DISTANCING DUE TO THE COVID-19 PANDEMIC.

## 2018-10-17 NOTE — Progress Notes (Signed)
PRE

## 2018-10-18 LAB — CMP14+EGFR
ALT: 18 IU/L (ref 0–32)
AST: 30 IU/L (ref 0–40)
Albumin/Globulin Ratio: 1.6 (ref 1.2–2.2)
Albumin: 5 g/dL — ABNORMAL HIGH (ref 3.8–4.9)
Alkaline Phosphatase: 83 IU/L (ref 39–117)
BUN/Creatinine Ratio: 13 (ref 12–28)
BUN: 11 mg/dL (ref 8–27)
Bilirubin Total: 0.6 mg/dL (ref 0.0–1.2)
CO2: 24 mmol/L (ref 20–29)
Calcium: 10.4 mg/dL — ABNORMAL HIGH (ref 8.7–10.3)
Chloride: 98 mmol/L (ref 96–106)
Creatinine, Ser: 0.82 mg/dL (ref 0.57–1.00)
GFR calc Af Amer: 90 mL/min/{1.73_m2} (ref >59)
GFR calc non Af Amer: 78 mL/min/{1.73_m2} (ref >59)
Globulin, Total: 3.2 g/dL (ref 1.5–4.5)
Glucose: 78 mg/dL (ref 65–99)
Potassium: 4.1 mmol/L (ref 3.5–5.2)
Sodium: 137 mmol/L (ref 134–144)
Total Protein: 8.2 g/dL (ref 6.0–8.5)

## 2018-10-18 LAB — CBC
Hematocrit: 41.2 % (ref 34.0–46.6)
Hemoglobin: 13 g/dL (ref 11.1–15.9)
MCH: 21.8 pg — ABNORMAL LOW (ref 26.6–33.0)
MCHC: 31.6 g/dL (ref 31.5–35.7)
MCV: 69 fL — ABNORMAL LOW (ref 79–97)
Platelets: 221 10*3/uL (ref 150–450)
RBC: 5.95 x10E6/uL — ABNORMAL HIGH (ref 3.77–5.28)
RDW: 14.8 % (ref 11.7–15.4)
WBC: 5 10*3/uL (ref 3.4–10.8)

## 2018-10-18 LAB — TSH: TSH: 1.02 u[IU]/mL (ref 0.450–4.500)

## 2018-10-24 ENCOUNTER — Other Ambulatory Visit: Payer: Self-pay | Admitting: Orthopaedic Surgery

## 2018-11-04 NOTE — H&P (Signed)
TOTAL HIP ADMISSION H&P  Patient is admitted for right total hip arthroplasty.  Subjective:  Chief Complaint: right hip pain  HPI: Linda Wagner, 58 y.o. female, has a history of pain and functional disability in the right hip(s) due to arthritis and patient has failed non-surgical conservative treatments for greater than 12 weeks to include NSAID's and/or analgesics, flexibility and strengthening excercises, use of assistive devices, weight reduction as appropriate and activity modification.  Onset of symptoms was gradual starting 5 years ago with gradually worsening course since that time.The patient noted no past surgery on the right hip(s).  Patient currently rates pain in the right hip at 10 out of 10 with activity. Patient has night pain, worsening of pain with activity and weight bearing, trendelenberg gait, pain that interfers with activities of daily living and crepitus. Patient has evidence of subchondral cysts, subchondral sclerosis, periarticular osteophytes and joint space narrowing by imaging studies. This condition presents safety issues increasing the risk of falls. There is no current active infection.  There are no active problems to display for this patient.  No past medical history on file.  No past surgical history on file.  No current facility-administered medications for this encounter.    Current Outpatient Medications  Medication Sig Dispense Refill Last Dose  . APPLE CIDER VINEGAR PO Take 1 capsule by mouth daily.     . cholecalciferol (VITAMIN D3) 25 MCG (1000 UT) tablet Take 1,000 Units by mouth daily.     . ferrous sulfate 325 (65 FE) MG tablet Take 325 mg by mouth daily with breakfast.     . flurandrenolide (CORDRAN) 0.05 % ointment Apply 1 application topically daily as needed (irritation).     . Multiple Vitamin (MULTIVITAMIN WITH MINERALS) TABS tablet Take 1 tablet by mouth daily.     Marland Kitchen zinc gluconate 50 MG tablet Take 50 mg by mouth daily as needed (cold  sympton).      No Known Allergies  Social History   Tobacco Use  . Smoking status: Never Smoker  . Smokeless tobacco: Never Used  Substance Use Topics  . Alcohol use: Never    Alcohol/week: 0.0 standard drinks    Frequency: Never    Family History  Problem Relation Age of Onset  . Diabetes Mother   . Diabetes Father   . Dementia Father      Review of Systems  Musculoskeletal: Positive for joint pain.       Right hip  All other systems reviewed and are negative.   Objective:  Physical Exam  Constitutional: She is oriented to person, place, and time. She appears well-developed and well-nourished.  HENT:  Head: Normocephalic and atraumatic.  Eyes: Pupils are equal, round, and reactive to light.  Neck: Normal range of motion.  Cardiovascular: Normal rate and regular rhythm.  Respiratory: Effort normal.  GI: Soft.  Musculoskeletal:     Comments: Examination of the right hip shows decreased range of motion with internal range of motion and pain at end internal range of motion.  She has no real tenderness palpation throughout.  She walks with a slight limp.  Normal sensation motor function throughout her leg.  She is neurovascularly intact distally.  Left hip range of motion is full and without pain.  Normal sensation motor function about the left leg.  She is neurovascularly intact distally.    Neurological: She is alert and oriented to person, place, and time.  Skin: Skin is warm and dry.  Psychiatric: She has  a normal mood and affect. Her behavior is normal. Judgment and thought content normal.    Vital signs in last 24 hours: BP: ()/()  Arterial Line BP: ()/()   Labs:   Estimated body mass index is 27.4 kg/m as calculated from the following:   Height as of 10/17/18: 5\' 2"  (1.575 m).   Weight as of 10/17/18: 67.9 kg.   Imaging Review Plain radiographs demonstrate severe degenerative joint disease of the right hip(s). The bone quality appears to be good for age  and reported activity level.      Assessment/Plan:  End stage primary arthritis, right hip(s)  The patient history, physical examination, clinical judgement of the provider and imaging studies are consistent with end stage degenerative joint disease of the right hip(s) and total hip arthroplasty is deemed medically necessary. The treatment options including medical management, injection therapy, arthroscopy and arthroplasty were discussed at length. The risks and benefits of total hip arthroplasty were presented and reviewed. The risks due to aseptic loosening, infection, stiffness, dislocation/subluxation,  thromboembolic complications and other imponderables were discussed.  The patient acknowledged the explanation, agreed to proceed with the plan and consent was signed. Patient is being admitted for inpatient treatment for surgery, pain control, PT, OT, prophylactic antibiotics, VTE prophylaxis, progressive ambulation and ADL's and discharge planning.The patient is planning to be discharged home with home health services

## 2018-11-05 ENCOUNTER — Encounter (HOSPITAL_COMMUNITY): Admission: RE | Admit: 2018-11-05 | Payer: BC Managed Care – PPO | Source: Ambulatory Visit

## 2018-11-12 ENCOUNTER — Ambulatory Visit: Admit: 2018-11-12 | Payer: BLUE CROSS/BLUE SHIELD | Admitting: Orthopaedic Surgery

## 2018-11-12 SURGERY — ARTHROPLASTY, HIP, TOTAL, ANTERIOR APPROACH
Anesthesia: Spinal | Site: Hip | Laterality: Right

## 2018-12-16 ENCOUNTER — Ambulatory Visit (INDEPENDENT_AMBULATORY_CARE_PROVIDER_SITE_OTHER): Payer: BC Managed Care – PPO | Admitting: Internal Medicine

## 2018-12-16 ENCOUNTER — Encounter: Payer: Self-pay | Admitting: Internal Medicine

## 2018-12-16 ENCOUNTER — Other Ambulatory Visit: Payer: Self-pay

## 2018-12-16 VITALS — BP 118/76 | HR 53 | Temp 98.5°F | Ht 62.0 in | Wt 151.0 lb

## 2018-12-16 DIAGNOSIS — H6123 Impacted cerumen, bilateral: Secondary | ICD-10-CM | POA: Diagnosis not present

## 2018-12-16 DIAGNOSIS — Z0000 Encounter for general adult medical examination without abnormal findings: Secondary | ICD-10-CM | POA: Diagnosis not present

## 2018-12-16 LAB — POCT URINALYSIS DIPSTICK
Bilirubin, UA: NEGATIVE
Glucose, UA: NEGATIVE
Ketones, UA: NEGATIVE
Leukocytes, UA: NEGATIVE
Nitrite, UA: NEGATIVE
Protein, UA: NEGATIVE
Spec Grav, UA: 1.01 (ref 1.010–1.025)
Urobilinogen, UA: 0.2 E.U./dL
pH, UA: 6 (ref 5.0–8.0)

## 2018-12-16 NOTE — Patient Instructions (Signed)
Health Maintenance, Female Adopting a healthy lifestyle and getting preventive care are important in promoting health and wellness. Ask your health care provider about:  The right schedule for you to have regular tests and exams.  Things you can do on your own to prevent diseases and keep yourself healthy. What should I know about diet, weight, and exercise? Eat a healthy diet   Eat a diet that includes plenty of vegetables, fruits, low-fat dairy products, and lean protein.  Do not eat a lot of foods that are high in solid fats, added sugars, or sodium. Maintain a healthy weight Body mass index (BMI) is used to identify weight problems. It estimates body fat based on height and weight. Your health care provider can help determine your BMI and help you achieve or maintain a healthy weight. Get regular exercise Get regular exercise. This is one of the most important things you can do for your health. Most adults should:  Exercise for at least 150 minutes each week. The exercise should increase your heart rate and make you sweat (moderate-intensity exercise).  Do strengthening exercises at least twice a week. This is in addition to the moderate-intensity exercise.  Spend less time sitting. Even light physical activity can be beneficial. Watch cholesterol and blood lipids Have your blood tested for lipids and cholesterol at 60 years of age, then have this test every 5 years. Have your cholesterol levels checked more often if:  Your lipid or cholesterol levels are high.  You are older than 60 years of age.  You are at high risk for heart disease. What should I know about cancer screening? Depending on your health history and family history, you may need to have cancer screening at various ages. This may include screening for:  Breast cancer.  Cervical cancer.  Colorectal cancer.  Skin cancer.  Lung cancer. What should I know about heart disease, diabetes, and high blood  pressure? Blood pressure and heart disease  High blood pressure causes heart disease and increases the risk of stroke. This is more likely to develop in people who have high blood pressure readings, are of African descent, or are overweight.  Have your blood pressure checked: ? Every 3-5 years if you are 18-39 years of age. ? Every year if you are 40 years old or older. Diabetes Have regular diabetes screenings. This checks your fasting blood sugar level. Have the screening done:  Once every three years after age 40 if you are at a normal weight and have a low risk for diabetes.  More often and at a younger age if you are overweight or have a high risk for diabetes. What should I know about preventing infection? Hepatitis B If you have a higher risk for hepatitis B, you should be screened for this virus. Talk with your health care provider to find out if you are at risk for hepatitis B infection. Hepatitis C Testing is recommended for:  Everyone born from 1945 through 1965.  Anyone with known risk factors for hepatitis C. Sexually transmitted infections (STIs)  Get screened for STIs, including gonorrhea and chlamydia, if: ? You are sexually active and are younger than 60 years of age. ? You are older than 60 years of age and your health care provider tells you that you are at risk for this type of infection. ? Your sexual activity has changed since you were last screened, and you are at increased risk for chlamydia or gonorrhea. Ask your health care provider if   you are at risk.  Ask your health care provider about whether you are at high risk for HIV. Your health care provider may recommend a prescription medicine to help prevent HIV infection. If you choose to take medicine to prevent HIV, you should first get tested for HIV. You should then be tested every 3 months for as long as you are taking the medicine. Pregnancy  If you are about to stop having your period (premenopausal) and  you may become pregnant, seek counseling before you get pregnant.  Take 400 to 800 micrograms (mcg) of folic acid every day if you become pregnant.  Ask for birth control (contraception) if you want to prevent pregnancy. Osteoporosis and menopause Osteoporosis is a disease in which the bones lose minerals and strength with aging. This can result in bone fractures. If you are 65 years old or older, or if you are at risk for osteoporosis and fractures, ask your health care provider if you should:  Be screened for bone loss.  Take a calcium or vitamin D supplement to lower your risk of fractures.  Be given hormone replacement therapy (HRT) to treat symptoms of menopause. Follow these instructions at home: Lifestyle  Do not use any products that contain nicotine or tobacco, such as cigarettes, e-cigarettes, and chewing tobacco. If you need help quitting, ask your health care provider.  Do not use street drugs.  Do not share needles.  Ask your health care provider for help if you need support or information about quitting drugs. Alcohol use  Do not drink alcohol if: ? Your health care provider tells you not to drink. ? You are pregnant, may be pregnant, or are planning to become pregnant.  If you drink alcohol: ? Limit how much you use to 0-1 drink a day. ? Limit intake if you are breastfeeding.  Be aware of how much alcohol is in your drink. In the [place] one drink equals one 12 oz bottle of beer (355 mL), one 5 oz glass of wine (148 mL), or one 1 oz glass of hard liquor (44 mL). General instructions  Schedule regular health, dental, and eye exams.  Stay current with your vaccines.  Tell your health care provider if: ? You often feel depressed. ? You have ever been abused or do not feel safe at home. Summary  Adopting a healthy lifestyle and getting preventive care are important in promoting health and wellness.  Follow your health care provider's instructions about healthy  diet, exercising, and getting tested or screened for diseases.  Follow your health care provider's instructions on monitoring your cholesterol and blood pressure. This information is not intended to replace advice given to you by your health care provider. Make sure you discuss any questions you have with your health care provider. Document Released: 07/04/2010 Document Revised: 12/12/2017 Document Reviewed: 12/12/2017 Elsevier Patient Education  2020 [employer]  

## 2018-12-16 NOTE — Progress Notes (Signed)
This visit occurred during the SARS-CoV-2 public health emergency.  Safety protocols were in place, including screening questions prior to the visit, additional usage of staff PPE, and extensive cleaning of exam room while observing appropriate contact time as indicated for disinfecting solutions.  Subjective:     Patient ID: Linda Wagner , female    DOB: 06/29/1960 , 58 y.o.   MRN: [id number]   Chief Complaint  Patient presents with  . Annual Exam    HPI  She is here today for a full physical examination.  She is followed by Dr. Philis Pique for her GYN care.  She was last seen June 2020.  She offers no complaints or concerns at this time.     History reviewed. No pertinent past medical history.   Family History  Problem Relation Age of Onset  . Diabetes Mother   . Diabetes Father   . Dementia Father      Current Outpatient Medications:  .  APPLE CIDER VINEGAR PO, Take 1 capsule by mouth daily., Disp: , Rfl:  .  cholecalciferol (VITAMIN D3) 25 MCG (1000 UT) tablet, Take 1,000 Units by mouth daily., Disp: , Rfl:  .  ferrous sulfate 325 (65 FE) MG tablet, Take 325 mg by mouth daily with breakfast., Disp: , Rfl:  .  flurandrenolide (CORDRAN) 0.05 % ointment, Apply 1 application topically daily as needed (irritation)., Disp: , Rfl:  .  Multiple Vitamin (MULTIVITAMIN WITH MINERALS) TABS tablet, Take 1 tablet by mouth daily., Disp: , Rfl:  .  zinc gluconate 50 MG tablet, Take 50 mg by mouth daily as needed (cold sympton)., Disp: , Rfl:  .  influenza vac split quadrivalent PF (FLUZONE QUADRIVALENT) 0.5 ML injection, Fluzone Quad 2019-2020 (PF) 60 mcg (15 mcg x 4)/0.5 mL IM syringe  TO BE ADMINISTERED BY PHARMACIST FOR IMMUNIZATION, Disp: , Rfl:    No Known Allergies    The patient states she uses none for birth control. Last LMP was Patient's last menstrual period was 07/09/2013 (approximate).. Negative for Dysmenorrhea Negative for: breast discharge, breast lump(s), breast pain and  breast self exam. Associated symptoms include abnormal vaginal bleeding. Pertinent negatives include abnormal bleeding (hematology), anxiety, decreased libido, depression, difficulty falling sleep, dyspareunia, history of infertility, nocturia, sexual dysfunction, sleep disturbances, urinary incontinence, urinary urgency, vaginal discharge and vaginal itching. Diet regular.The patient states her exercise level is    . The patient's tobacco use is:  Social History   Tobacco Use  Smoking Status Never Smoker  Smokeless Tobacco Never Used  . She has been exposed to passive smoke. The patient's alcohol use is:  Social History   Substance and Sexual Activity  Alcohol Use Never  . Alcohol/week: 0.0 standard drinks    Review of Systems  Constitutional: Negative.   HENT: Negative.   Eyes: Negative.   Respiratory: Negative.   Cardiovascular: Negative.   Endocrine: Negative.   Genitourinary: Negative.   Musculoskeletal: Negative.   Skin: Negative.   Allergic/Immunologic: Negative.   Neurological: Negative.   Hematological: Negative.   Psychiatric/Behavioral: Negative.      Today's Vitals   12/16/18 1048  BP: 118/76  Pulse: (!) 53  Temp: 98.5 F (36.9 C)  TempSrc: Oral  Weight: 151 lb (68.5 kg)  Height: 5\' 2"  (1.575 m)   Body mass index is 27.62 kg/m.   Objective:  Physical Exam Vitals and nursing note reviewed.  Constitutional:      Appearance: Normal appearance.  HENT:     Head: Normocephalic and  atraumatic.     Right Ear: Ear canal and external ear normal. There is impacted cerumen.     Left Ear: Ear canal and external ear normal. There is impacted cerumen.     Nose:     Comments: Deferred, masked    Mouth/Throat:     Comments: Deferred, masked Eyes:     Extraocular Movements: Extraocular movements intact.     Conjunctiva/sclera: Conjunctivae normal.     Pupils: Pupils are equal, round, and reactive to light.  Cardiovascular:     Rate and Rhythm: Normal rate and  regular rhythm.     Pulses: Normal pulses.     Heart sounds: Normal heart sounds.  Pulmonary:     Effort: Pulmonary effort is normal.     Breath sounds: Normal breath sounds.  Chest:     Breasts: Tanner Score is 5.        Right: Normal.        Left: Normal.  Abdominal:     General: Abdomen is flat. Bowel sounds are normal.     Palpations: Abdomen is soft.  Genitourinary:    Comments: deferred Musculoskeletal:        General: Normal range of motion.     Cervical back: Normal range of motion and neck supple.  Skin:    General: Skin is warm and dry.  Neurological:     General: No focal deficit present.     Mental Status: She is alert and oriented to person, place, and time.  Psychiatric:        Mood and Affect: Mood normal.        Behavior: Behavior normal.         Assessment And Plan:     1. Routine general medical examination at health care facility  A full exam was performed.  Importance of monthly self breast exams was discussed with the patient. She had labwork CBC, CMP drawn in October 2020, results were reviewed in full detail. Therefore, I will only check lipid panel today.  PATIENT IS ADVISED TO GET 30-45 MINUTES REGULAR EXERCISE NO LESS THAN FOUR TO FIVE DAYS PER WEEK - BOTH WEIGHTBEARING EXERCISES AND AEROBIC ARE RECOMMENDED.  SHE IS ADVISED TO FOLLOW A HEALTHY DIET WITH AT LEAST SIX FRUITS/VEGGIES PER DAY, DECREASE INTAKE OF RED MEAT, AND TO INCREASE FISH INTAKE TO TWO DAYS PER WEEK.  MEATS/FISH SHOULD NOT BE FRIED, BAKED OR BROILED IS PREFERABLE.  I SUGGEST WEARING SPF 50 SUNSCREEN ON EXPOSED PARTS AND ESPECIALLY WHEN IN THE DIRECT SUNLIGHT FOR AN EXTENDED PERIOD OF TIME.  PLEASE AVOID FAST FOOD RESTAURANTS AND INCREASE YOUR WATER INTAKE.  - Lipid panel - POCT Urinalysis Dipstick (81002)  2. Bilateral impacted cerumen  Unfortunately, her ears were NOT flushed prior to leaving the appointment. She may use OTC debrox gtt or return to office for irrigation.    Gwynneth Aliment, MD    THE PATIENT IS ENCOURAGED TO PRACTICE SOCIAL DISTANCING DUE TO THE COVID-19 PANDEMIC.

## 2018-12-17 LAB — LIPID PANEL
Chol/HDL Ratio: 2.7 ratio (ref 0.0–4.4)
Cholesterol, Total: 207 mg/dL — ABNORMAL HIGH (ref 100–199)
HDL: 78 mg/dL (ref >39)
LDL Chol Calc (NIH): 119 mg/dL — ABNORMAL HIGH (ref 0–99)
Triglycerides: 55 mg/dL (ref 0–149)
VLDL Cholesterol Cal: 10 mg/dL (ref 5–40)

## 2019-03-09 ENCOUNTER — Encounter: Payer: Self-pay | Admitting: Internal Medicine

## 2019-03-11 ENCOUNTER — Telehealth: Payer: Self-pay

## 2019-03-11 NOTE — Telephone Encounter (Signed)
I called and scheduled the pt an appt for a cholesterol and a1c follow-up.

## 2019-06-16 ENCOUNTER — Other Ambulatory Visit: Payer: Self-pay

## 2019-06-16 ENCOUNTER — Other Ambulatory Visit: Payer: BC Managed Care – PPO

## 2019-06-16 DIAGNOSIS — R7309 Other abnormal glucose: Secondary | ICD-10-CM

## 2019-06-17 LAB — LIPID PANEL
Chol/HDL Ratio: 2.4 ratio (ref 0.0–4.4)
Cholesterol, Total: 164 mg/dL (ref 100–199)
HDL: 67 mg/dL (ref >39)
LDL Chol Calc (NIH): 84 mg/dL (ref 0–99)
Triglycerides: 69 mg/dL (ref 0–149)
VLDL Cholesterol Cal: 13 mg/dL (ref 5–40)

## 2019-06-17 LAB — HEMOGLOBIN A1C
Est. average glucose Bld gHb Est-mCnc: 134 mg/dL
Hgb A1c MFr Bld: 6.3 % — ABNORMAL HIGH (ref 4.8–5.6)

## 2019-06-30 ENCOUNTER — Ambulatory Visit: Payer: Self-pay | Admitting: Internal Medicine

## 2019-07-02 LAB — HM MAMMOGRAPHY

## 2020-01-07 ENCOUNTER — Encounter: Payer: Self-pay | Admitting: Internal Medicine

## 2020-01-07 ENCOUNTER — Ambulatory Visit (INDEPENDENT_AMBULATORY_CARE_PROVIDER_SITE_OTHER): Payer: BC Managed Care – PPO | Admitting: Internal Medicine

## 2020-01-07 ENCOUNTER — Other Ambulatory Visit: Payer: Self-pay

## 2020-01-07 VITALS — BP 128/72 | HR 65 | Temp 97.8°F | Ht 62.2 in | Wt 154.4 lb

## 2020-01-07 DIAGNOSIS — E559 Vitamin D deficiency, unspecified: Secondary | ICD-10-CM | POA: Diagnosis not present

## 2020-01-07 DIAGNOSIS — H1132 Conjunctival hemorrhage, left eye: Secondary | ICD-10-CM

## 2020-01-07 DIAGNOSIS — Z0000 Encounter for general adult medical examination without abnormal findings: Secondary | ICD-10-CM

## 2020-01-07 NOTE — Patient Instructions (Addendum)
Health Maintenance, Female Adopting a healthy lifestyle and getting preventive care are important in promoting health and wellness. Ask your health care provider about:  The right schedule for you to have regular tests and exams.  Things you can do on your own to prevent diseases and keep yourself healthy. What should I know about diet, weight, and exercise? Eat a healthy diet   Eat a diet that includes plenty of vegetables, fruits, low-fat dairy products, and lean protein.  Do not eat a lot of foods that are high in solid fats, added sugars, or sodium. Maintain a healthy weight Body mass index (BMI) is used to identify weight problems. It estimates body fat based on height and weight. Your health care provider can help determine your BMI and help you achieve or maintain a healthy weight. Get regular exercise Get regular exercise. This is one of the most important things you can do for your health. Most adults should:  Exercise for at least 150 minutes each week. The exercise should increase your heart rate and make you sweat (moderate-intensity exercise).  Do strengthening exercises at least twice a week. This is in addition to the moderate-intensity exercise.  Spend less time sitting. Even light physical activity can be beneficial. Watch cholesterol and blood lipids Have your blood tested for lipids and cholesterol at 62 years of age, then have this test every 5 years. Have your cholesterol levels checked more often if:  Your lipid or cholesterol levels are high.  You are older than 62 years of age.  You are at high risk for heart disease. What should I know about cancer screening? Depending on your health history and family history, you may need to have cancer screening at various ages. This may include screening for:  Breast cancer.  Cervical cancer.  Colorectal cancer.  Skin cancer.  Lung cancer. What should I know about heart disease, diabetes, and high blood  pressure? Blood pressure and heart disease  High blood pressure causes heart disease and increases the risk of stroke. This is more likely to develop in people who have high blood pressure readings, are of African descent, or are overweight.  Have your blood pressure checked: ? Every 3-5 years if you are 18-39 years of age. ? Every year if you are 40 years old or older. Diabetes Have regular diabetes screenings. This checks your fasting blood sugar level. Have the screening done:  Once every three years after age 40 if you are at a normal weight and have a low risk for diabetes.  More often and at a younger age if you are overweight or have a high risk for diabetes. What should I know about preventing infection? Hepatitis B If you have a higher risk for hepatitis B, you should be screened for this virus. Talk with your health care provider to find out if you are at risk for hepatitis B infection. Hepatitis C Testing is recommended for:  Everyone born from 1945 through 1965.  Anyone with known risk factors for hepatitis C. Sexually transmitted infections (STIs)  Get screened for STIs, including gonorrhea and chlamydia, if: ? You are sexually active and are younger than 62 years of age. ? You are older than 62 years of age and your health care provider tells you that you are at risk for this type of infection. ? Your sexual activity has changed since you were last screened, and you are at increased risk for chlamydia or gonorrhea. Ask your health care provider if   you are at risk.  Ask your health care provider about whether you are at high risk for HIV. Your health care provider may recommend a prescription medicine to help prevent HIV infection. If you choose to take medicine to prevent HIV, you should first get tested for HIV. You should then be tested every 3 months for as long as you are taking the medicine. Pregnancy  If you are about to stop having your period (premenopausal) and  you may become pregnant, seek counseling before you get pregnant.  Take 400 to 800 micrograms (mcg) of folic acid every day if you become pregnant.  Ask for birth control (contraception) if you want to prevent pregnancy. Osteoporosis and menopause Osteoporosis is a disease in which the bones lose minerals and strength with aging. This can result in bone fractures. If you are 65 years old or older, or if you are at risk for osteoporosis and fractures, ask your health care provider if you should:  Be screened for bone loss.  Take a calcium or vitamin D supplement to lower your risk of fractures.  Be given hormone replacement therapy (HRT) to treat symptoms of menopause. Follow these instructions at home: Lifestyle  Do not use any products that contain nicotine or tobacco, such as cigarettes, e-cigarettes, and chewing tobacco. If you need help quitting, ask your health care provider.  Do not use street drugs.  Do not share needles.  Ask your health care provider for help if you need support or information about quitting drugs. Alcohol use  Do not drink alcohol if: ? Your health care provider tells you not to drink. ? You are pregnant, may be pregnant, or are planning to become pregnant.  If you drink alcohol: ? Limit how much you use to 0-1 drink a day. ? Limit intake if you are breastfeeding.  Be aware of how much alcohol is in your drink. In the [place] one drink equals one 12 oz bottle of beer (355 mL), one 5 oz glass of wine (148 mL), or one 1 oz glass of hard liquor (44 mL). General instructions  Schedule regular health, dental, and eye exams.  Stay current with your vaccines.  Tell your health care provider if: ? You often feel depressed. ? You have ever been abused or do not feel safe at home. Summary  Adopting a healthy lifestyle and getting preventive care are important in promoting health and wellness.  Follow your health care provider's instructions about healthy  diet, exercising, and getting tested or screened for diseases.  Follow your health care provider's instructions on monitoring your cholesterol and blood pressure. This information is not intended to replace advice given to you by your health care provider. Make sure you discuss any questions you have with your health care provider. Document Revised: 12/12/2017 Document Reviewed: 12/12/2017 Elsevier Patient Education  2020 [employer]  Health Maintenance, Female Adopting a healthy lifestyle and getting preventive care are important in promoting health and wellness. Ask your health care provider about:  The right schedule for you to have regular tests and exams.  Things you can do on your own to prevent diseases and keep yourself healthy. What should I know about diet, weight, and exercise? Eat a healthy diet   Eat a diet that includes plenty of vegetables, fruits, low-fat dairy products, and lean protein.  Do not eat a lot of foods that are high in solid fats, added sugars, or sodium. Maintain a healthy weight Body mass index (BMI) is   used to identify weight problems. It estimates body fat based on height and weight. Your health care provider can help determine your BMI and help you achieve or maintain a healthy weight. Get regular exercise Get regular exercise. This is one of the most important things you can do for your health. Most adults should:  Exercise for at least 150 minutes each week. The exercise should increase your heart rate and make you sweat (moderate-intensity exercise).  Do strengthening exercises at least twice a week. This is in addition to the moderate-intensity exercise.  Spend less time sitting. Even light physical activity can be beneficial. Watch cholesterol and blood lipids Have your blood tested for lipids and cholesterol at 62 years of age, then have this test every 5 years. Have your cholesterol levels checked more often if:  Your lipid or cholesterol  levels are high.  You are older than 62 years of age.  You are at high risk for heart disease. What should I know about cancer screening? Depending on your health history and family history, you may need to have cancer screening at various ages. This may include screening for:  Breast cancer.  Cervical cancer.  Colorectal cancer.  Skin cancer.  Lung cancer. What should I know about heart disease, diabetes, and high blood pressure? Blood pressure and heart disease  High blood pressure causes heart disease and increases the risk of stroke. This is more likely to develop in people who have high blood pressure readings, are of African descent, or are overweight.  Have your blood pressure checked: ? Every 3-5 years if you are 18-39 years of age. ? Every year if you are 40 years old or older. Diabetes Have regular diabetes screenings. This checks your fasting blood sugar level. Have the screening done:  Once every three years after age 40 if you are at a normal weight and have a low risk for diabetes.  More often and at a younger age if you are overweight or have a high risk for diabetes. What should I know about preventing infection? Hepatitis B If you have a higher risk for hepatitis B, you should be screened for this virus. Talk with your health care provider to find out if you are at risk for hepatitis B infection. Hepatitis C Testing is recommended for:  Everyone born from 1945 through 1965.  Anyone with known risk factors for hepatitis C. Sexually transmitted infections (STIs)  Get screened for STIs, including gonorrhea and chlamydia, if: ? You are sexually active and are younger than 62 years of age. ? You are older than 62 years of age and your health care provider tells you that you are at risk for this type of infection. ? Your sexual activity has changed since you were last screened, and you are at increased risk for chlamydia or gonorrhea. Ask your health care  provider if you are at risk.  Ask your health care provider about whether you are at high risk for HIV. Your health care provider may recommend a prescription medicine to help prevent HIV infection. If you choose to take medicine to prevent HIV, you should first get tested for HIV. You should then be tested every 3 months for as long as you are taking the medicine. Pregnancy  If you are about to stop having your period (premenopausal) and you may become pregnant, seek counseling before you get pregnant.  Take 400 to 800 micrograms (mcg) of folic acid every day if you become pregnant.  Ask   for birth control (contraception) if you want to prevent pregnancy. Osteoporosis and menopause Osteoporosis is a disease in which the bones lose minerals and strength with aging. This can result in bone fractures. If you are 65 years old or older, or if you are at risk for osteoporosis and fractures, ask your health care provider if you should:  Be screened for bone loss.  Take a calcium or vitamin D supplement to lower your risk of fractures.  Be given hormone replacement therapy (HRT) to treat symptoms of menopause. Follow these instructions at home: Lifestyle  Do not use any products that contain nicotine or tobacco, such as cigarettes, e-cigarettes, and chewing tobacco. If you need help quitting, ask your health care provider.  Do not use street drugs.  Do not share needles.  Ask your health care provider for help if you need support or information about quitting drugs. Alcohol use  Do not drink alcohol if: ? Your health care provider tells you not to drink. ? You are pregnant, may be pregnant, or are planning to become pregnant.  If you drink alcohol: ? Limit how much you use to 0-1 drink a day. ? Limit intake if you are breastfeeding.  Be aware of how much alcohol is in your drink. In the [place] one drink equals one 12 oz bottle of beer (355 mL), one 5 oz glass of wine (148 mL), or one 1  oz glass of hard liquor (44 mL). General instructions  Schedule regular health, dental, and eye exams.  Stay current with your vaccines.  Tell your health care provider if: ? You often feel depressed. ? You have ever been abused or do not feel safe at home. Summary  Adopting a healthy lifestyle and getting preventive care are important in promoting health and wellness.  Follow your health care provider's instructions about healthy diet, exercising, and getting tested or screened for diseases.  Follow your health care provider's instructions on monitoring your cholesterol and blood pressure. This information is not intended to replace advice given to you by your health care provider. Make sure you discuss any questions you have with your health care provider. Document Revised: 12/12/2017 Document Reviewed: 12/12/2017 Elsevier Patient Education  2020 Else[employer] Inc.  

## 2020-01-07 NOTE — Progress Notes (Signed)
Rutherford Nail as a scribe for Maximino Greenland, MD.,have documented all relevant documentation on the behalf of Maximino Greenland, MD,as directed by  Maximino Greenland, MD while in the presence of Maximino Greenland, MD. This visit occurred during the SARS-CoV-2 public health emergency.  Safety protocols were in place, including screening questions prior to the visit, additional usage of staff PPE, and extensive cleaning of exam room while observing appropriate contact time as indicated for disinfecting solutions.  Subjective:     Patient ID: Linda Wagner , female    DOB: [birth year] , 60 y.o.   MRN: [id number]   Chief Complaint  Patient presents with  . Annual Exam  . Eye Problem    Blood vessel in (L) eye     HPI  She is here today for a full physical examination.  She is followed by Dr. Philis Pique for her GYN care. Last visit was 07/02/2019. She does report that she has had left eye redness for about a week. She first noticed this last Friday. There is no pain. She denies having visual disturbances.  She denies trauma. Not sure what may have triggered her sx.     Past Medical History:  Diagnosis Date  . Hip osteoarthritis   . Prediabetes      Family History  Problem Relation Age of Onset  . Diabetes Mother   . Diabetes Father   . Dementia Father      Current Outpatient Medications:  .  APPLE CIDER VINEGAR PO, Take 1 capsule by mouth daily., Disp: , Rfl:  .  cholecalciferol (VITAMIN D3) 25 MCG (1000 UT) tablet, Take 1,000 Units by mouth daily., Disp: , Rfl:  .  ferrous sulfate 325 (65 FE) MG tablet, Take 325 mg by mouth daily with breakfast., Disp: , Rfl:  .  Multiple Vitamin (MULTIVITAMIN WITH MINERALS) TABS tablet, Take 1 tablet by mouth daily., Disp: , Rfl:  .  zinc gluconate 50 MG tablet, Take 50 mg by mouth daily as needed (cold sympton)., Disp: , Rfl:  .  flurandrenolide (CORDRAN) 0.05 % ointment, Apply 1 application topically daily as needed (irritation). (Patient  not taking: Reported on 01/07/2020), Disp: , Rfl:  .  influenza vac split quadrivalent PF (FLUZONE QUADRIVALENT) 0.5 ML injection, Fluzone Quad 2019-2020 (PF) 60 mcg (15 mcg x 4)/0.5 mL IM syringe  TO BE ADMINISTERED BY PHARMACIST FOR IMMUNIZATION (Patient not taking: Reported on 01/07/2020), Disp: , Rfl:    No Known Allergies    The patient states she uses none for birth control. Last LMP was Patient's last menstrual period was 07/09/2013 (approximate).. Negative for Dysmenorrhea. Negative for: breast discharge, breast lump(s), breast pain and breast self exam. Associated symptoms include abnormal vaginal bleeding. Pertinent negatives include abnormal bleeding (hematology), anxiety, decreased libido, depression, difficulty falling sleep, dyspareunia, history of infertility, nocturia, sexual dysfunction, sleep disturbances, urinary incontinence, urinary urgency, vaginal discharge and vaginal itching. Diet regular.The patient states her exercise level is  moderate.  . The patient's tobacco use is:  Social History   Tobacco Use  Smoking Status Never Smoker  Smokeless Tobacco Never Used  . She has been exposed to passive smoke. The patient's alcohol use is:  Social History   Substance and Sexual Activity  Alcohol Use Never  . Alcohol/week: 0.0 standard drinks    Review of Systems  Constitutional: Negative.  Negative for fatigue.  HENT: Negative.   Eyes: Positive for redness.  Respiratory: Negative.   Cardiovascular: Negative.   Gastrointestinal:  Negative.   Endocrine: Negative.  Negative for polydipsia, polyphagia and polyuria.  Genitourinary: Negative.   Musculoskeletal: Negative.   Skin: Negative.   Allergic/Immunologic: Negative.   Neurological: Negative.  Negative for dizziness and headaches.  Psychiatric/Behavioral: Negative.      Today's Vitals   01/07/20 0910  BP: 128/72  Pulse: 65  Temp: 97.8 F (36.6 C)  TempSrc: Oral  Weight: 154 lb 6.4 oz (70 kg)  Height: 5' 2.2"  (1.58 m)   Body mass index is 28.06 kg/m.  Wt Readings from Last 3 Encounters:  01/07/20 154 lb 6.4 oz (70 kg)  12/16/18 151 lb (68.5 kg)  10/17/18 149 lb 12.8 oz (67.9 kg)   Objective:  Physical Exam Vitals and nursing note reviewed.  Constitutional:      Appearance: Normal appearance.  HENT:     Head: Normocephalic and atraumatic.     Right Ear: Tympanic membrane, ear canal and external ear normal.     Left Ear: Tympanic membrane, ear canal and external ear normal.     Nose:     Comments: Deferred, masked    Mouth/Throat:     Comments: Deferred, masked Eyes:     General: Lids are normal. Vision grossly intact. Gaze aligned appropriately.     Extraocular Movements: Extraocular movements intact.     Conjunctiva/sclera: Conjunctivae normal.     Pupils: Pupils are equal, round, and reactive to light.     Comments: Left eye redness, medial   Cardiovascular:     Rate and Rhythm: Normal rate and regular rhythm.     Pulses: Normal pulses.     Heart sounds: Normal heart sounds.  Pulmonary:     Effort: Pulmonary effort is normal.     Breath sounds: Normal breath sounds.  Chest:  Breasts:     Tanner Score is 5.     Right: Normal.     Left: Normal.    Abdominal:     General: Abdomen is flat. Bowel sounds are normal.     Palpations: Abdomen is soft.  Genitourinary:    Comments: deferred Musculoskeletal:        General: Normal range of motion.     Cervical back: Normal range of motion and neck supple.  Skin:    General: Skin is warm and dry.  Neurological:     General: No focal deficit present.     Mental Status: She is alert and oriented to person, place, and time.  Psychiatric:        Mood and Affect: Mood normal.        Behavior: Behavior normal.         Assessment And Plan:     1. Routine general medical examination at health care facility Comments: A full exam was performed.  Importance of monthly self breast exams was discussed with the patient. PATIENT IS  ADVISED TO GET 30-45 MINUTES REGULAR EXERCISE NO LESS THAN FOUR TO FIVE DAYS PER WEEK - BOTH WEIGHTBEARING EXERCISES AND AEROBIC ARE RECOMMENDED.  PATIENT IS ADVISED TO FOLLOW A HEALTHY DIET WITH AT LEAST SIX FRUITS/VEGGIES PER DAY, DECREASE INTAKE OF RED MEAT, AND TO INCREASE FISH INTAKE TO TWO DAYS PER WEEK.  MEATS/FISH SHOULD NOT BE FRIED, BAKED OR BROILED IS PREFERABLE.  I SUGGEST WEARING SPF 50 SUNSCREEN ON EXPOSED PARTS AND ESPECIALLY WHEN IN THE DIRECT SUNLIGHT FOR AN EXTENDED PERIOD OF TIME.  PLEASE AVOID FAST FOOD RESTAURANTS AND INCREASE YOUR WATER INTAKE.  - CMP14+EGFR - VITAMIN D 25 Hydroxy (  Vit-D Deficiency, Fractures) - CBC - Hemoglobin A1c - Lipid panel  2. Subconjunctival hemorrhage of left eye Comments: Pt advised that it can take 2-3 weeks for blood to completely resorb. Advised to seek evaluation if she develops any visual disturbances.   3. Vitamin D deficiency disease Comments: I will check a vitamin D level and supplement as needed.   Patient was given opportunity to ask questions. Patient verbalized understanding of the plan and was able to repeat key elements of the plan. All questions were answered to their satisfaction.  Maximino Greenland, MD   I, Maximino Greenland, MD, have reviewed all documentation for this visit. The documentation on 01/25/20 for the exam, diagnosis, procedures, and orders are all accurate and complete.  THE PATIENT IS ENCOURAGED TO PRACTICE SOCIAL DISTANCING DUE TO THE COVID-19 PANDEMIC.

## 2020-01-09 LAB — LIPID PANEL
Chol/HDL Ratio: 3 ratio (ref 0.0–4.4)
Cholesterol, Total: 217 mg/dL — ABNORMAL HIGH (ref 100–199)
HDL: 72 mg/dL (ref >39)
LDL Chol Calc (NIH): 134 mg/dL — ABNORMAL HIGH (ref 0–99)
Triglycerides: 63 mg/dL (ref 0–149)
VLDL Cholesterol Cal: 11 mg/dL (ref 5–40)

## 2020-01-09 LAB — CBC
Hematocrit: 44.8 % (ref 34.0–46.6)
Hemoglobin: 13.4 g/dL (ref 11.1–15.9)
MCH: 21.1 pg — ABNORMAL LOW (ref 26.6–33.0)
MCHC: 29.9 g/dL — ABNORMAL LOW (ref 31.5–35.7)
MCV: 71 fL — ABNORMAL LOW (ref 79–97)
Platelets: 220 10*3/uL (ref 150–450)
RBC: 6.35 x10E6/uL — ABNORMAL HIGH (ref 3.77–5.28)
RDW: 14.9 % (ref 11.7–15.4)
WBC: 5 10*3/uL (ref 3.4–10.8)

## 2020-01-09 LAB — CMP14+EGFR
ALT: 16 IU/L (ref 0–32)
AST: 26 IU/L (ref 0–40)
Albumin/Globulin Ratio: 1.4 (ref 1.2–2.2)
Albumin: 4.8 g/dL (ref 3.8–4.8)
Alkaline Phosphatase: 92 IU/L (ref 44–121)
BUN/Creatinine Ratio: 18 (ref 12–28)
BUN: 16 mg/dL (ref 8–27)
Bilirubin Total: 0.4 mg/dL (ref 0.0–1.2)
CO2: 24 mmol/L (ref 20–29)
Calcium: 9.9 mg/dL (ref 8.7–10.3)
Chloride: 98 mmol/L (ref 96–106)
Creatinine, Ser: 0.87 mg/dL (ref 0.57–1.00)
GFR calc Af Amer: 83 mL/min/{1.73_m2} (ref >59)
GFR calc non Af Amer: 72 mL/min/{1.73_m2} (ref >59)
Globulin, Total: 3.5 g/dL (ref 1.5–4.5)
Glucose: 68 mg/dL (ref 65–99)
Potassium: 4.3 mmol/L (ref 3.5–5.2)
Sodium: 136 mmol/L (ref 134–144)
Total Protein: 8.3 g/dL (ref 6.0–8.5)

## 2020-01-09 LAB — VITAMIN D 25 HYDROXY (VIT D DEFICIENCY, FRACTURES): Vit D, 25-Hydroxy: 57.2 ng/mL (ref 30.0–100.0)

## 2020-01-09 LAB — HEMOGLOBIN A1C
Est. average glucose Bld gHb Est-mCnc: 143 mg/dL
Hgb A1c MFr Bld: 6.6 % — ABNORMAL HIGH (ref 4.8–5.6)

## 2020-02-09 ENCOUNTER — Encounter: Payer: Self-pay | Admitting: Internal Medicine

## 2020-05-12 ENCOUNTER — Encounter: Payer: Self-pay | Admitting: Internal Medicine

## 2020-05-17 ENCOUNTER — Encounter: Payer: Self-pay | Admitting: Internal Medicine

## 2020-06-04 ENCOUNTER — Encounter: Payer: Self-pay | Admitting: Internal Medicine

## 2020-06-07 ENCOUNTER — Encounter: Payer: Self-pay | Admitting: Internal Medicine

## 2020-06-07 ENCOUNTER — Telehealth: Payer: Self-pay

## 2020-06-07 NOTE — Telephone Encounter (Signed)
Spoke with patient about the process of releasing records. Patient informed that records can not be released from a [employer]. Patient advised to have Dr Helaine Chess offices to send a release form to our office. Patient is understanding.

## 2020-07-07 ENCOUNTER — Encounter: Payer: Self-pay | Admitting: Internal Medicine

## 2020-07-27 ENCOUNTER — Other Ambulatory Visit: Payer: Self-pay

## 2020-07-27 ENCOUNTER — Ambulatory Visit
Admission: EM | Admit: 2020-07-27 | Discharge: 2020-07-27 | Disposition: A | Discharge status: Home / Self Care | Payer: BC Managed Care – PPO | Attending: Student | Admitting: Student

## 2020-07-27 DIAGNOSIS — R311 Benign essential microscopic hematuria: Secondary | ICD-10-CM

## 2020-07-27 DIAGNOSIS — R1012 Left upper quadrant pain: Secondary | ICD-10-CM

## 2020-07-27 LAB — POCT URINALYSIS DIP (MANUAL ENTRY)
Bilirubin, UA: NEGATIVE
Glucose, UA: NEGATIVE mg/dL
Ketones, POC UA: NEGATIVE mg/dL
Leukocytes, UA: NEGATIVE
Nitrite, UA: NEGATIVE
Protein Ur, POC: NEGATIVE mg/dL
Spec Grav, UA: 1.025 (ref 1.010–1.025)
Urobilinogen, UA: 0.2 E.U./dL
pH, UA: 6 (ref 5.0–8.0)

## 2020-07-27 MED ORDER — TAMSULOSIN HCL 0.4 MG PO CAPS: 0.4000 mg | ORAL_CAPSULE | Freq: Every day | ORAL | 0 refills | Status: AC

## 2020-07-27 NOTE — Discharge Instructions (Addendum)
-  I think that you have a kidney stone, based on your exam and urine results.  -To help this pass, drink lots of water! You can also start the Tamsulosin (Flomax)- one pill daily while symptoms persist.  -Tylenol and ibuprofen for pain -Rarely, kidney stones can be so big that it does not pass on its own.  If this happens, you will develop worsening abdominal pain, new back pain, new fever/chills, etc.  Head to the emergency department for further evaluation and management of that happens.

## 2020-07-27 NOTE — ED Triage Notes (Addendum)
Pt presents with off and on left upper abdominal pain x 5 days. Report the pain comes and goes sporadically and is dull in nature. Denies chest pain, shortness of breath, nausea, or any other symptoms. Pt has not tried otc medications.

## 2020-07-27 NOTE — ED Provider Notes (Signed)
[facility]    CSN: [id number] Arrival date & time: 07/27/20  1001      History   Chief Complaint Chief Complaint  Patient presents with   Abdominal Pain    HPI Linda Wagner is a 60 y.o. female presenting with left upper quadrant pain for 5 days.  Medical history prediabetes.  Describes this pain as colicky and intermittent, isolated to the left upper quadrant without radiation to the back or groin.  Denies any other symptoms, including hematuria, dysuria, frequency, flank pain, fever/chills, vaginal discharge.  Denies history of kidney stone or kidney disease in the past.  Denies change in bowel or bladder function, bowel movements are regular and she had 1 today and is still passing gas.  Denies fever/chills.  HPI  Past Medical History:  Diagnosis Date   Hip osteoarthritis    Prediabetes     There are no problems to display for this patient.   History reviewed. No pertinent surgical history.  OB History   No obstetric history on file.      Home Medications    Prior to Admission medications   Medication Sig Start Date End Date Taking? Authorizing Provider  tamsulosin (FLOMAX) 0.4 MG CAPS capsule Take 1 capsule (0.4 mg total) by mouth daily after breakfast for 7 days. One pill daily with breakfast or lunch while symptoms persist 07/27/20 08/03/20 Yes Rhys Martini, PA-C  APPLE CIDER VINEGAR PO Take 1 capsule by mouth daily.    [provider]  cholecalciferol (VITAMIN D3) 25 MCG (1000 UT) tablet Take 1,000 Units by mouth daily.    [provider]  ferrous sulfate 325 (65 FE) MG tablet Take 325 mg by mouth daily with breakfast.    [provider]  flurandrenolide (CORDRAN) 0.05 % ointment Apply 1 application topically daily as needed (irritation). Patient not taking: Reported on 01/07/2020    [provider]  influenza vac split quadrivalent PF (FLUZONE QUADRIVALENT) 0.5 ML injection Fluzone Quad 2019-2020 (PF) 60 mcg  (15 mcg x 4)/0.5 mL IM syringe  TO BE ADMINISTERED BY PHARMACIST FOR IMMUNIZATION Patient not taking: Reported on 01/07/2020    [provider]  Multiple Vitamin (MULTIVITAMIN WITH MINERALS) TABS tablet Take 1 tablet by mouth daily.    [provider]  zinc gluconate 50 MG tablet Take 50 mg by mouth daily as needed (cold sympton).    [provider]    Family History Family History  Problem Relation Age of Onset   Diabetes Mother    Diabetes Father    Dementia Father     Social History Social History   Tobacco Use   Smoking status: Never   Smokeless tobacco: Never  Vaping Use   Vaping Use: Never used  Substance Use Topics   Alcohol use: Never    Alcohol/week: 0.0 standard drinks   Drug use: Never     Allergies   Patient has no known allergies.   Review of Systems Review of Systems  Constitutional:  Negative for appetite change, chills, diaphoresis, fever and unexpected weight change.  HENT:  Negative for congestion, ear pain, sinus pressure, sinus pain, sneezing, sore throat and trouble swallowing.   Respiratory:  Negative for cough, chest tightness and shortness of breath.   Cardiovascular:  Negative for chest pain.  Gastrointestinal:  Positive for abdominal pain. Negative for abdominal distention, anal bleeding, blood in stool, constipation, diarrhea, nausea, rectal pain and vomiting.  Genitourinary:  Negative for dysuria, flank pain, frequency  and urgency.  Musculoskeletal:  Negative for back pain and myalgias.  Neurological:  Negative for dizziness, light-headedness and headaches.  All other systems reviewed and are negative.   Physical Exam Triage Vital Signs ED Triage Vitals  Enc Vitals Group     BP 07/27/20 1013 124/79     Pulse Rate 07/27/20 1013 (!) 55     Resp 07/27/20 1013 19     Temp 07/27/20 1013 97.9 F (36.6 C)     Temp src --      SpO2 07/27/20 1013 98 %     Weight --      Height --      Head Circumference --       Peak Flow --      Pain Score 07/27/20 1012 0     Pain Loc --      Pain Edu? --      Excl. in GC? --    No data found.  Updated Vital Signs BP 124/79   Pulse (!) 55   Temp 97.9 F (36.6 C)   Resp 19   LMP 07/09/2013 (Approximate) Comment: post menop  SpO2 98%   Visual Acuity Right Eye Distance:   Left Eye Distance:   Bilateral Distance:    Right Eye Near:   Left Eye Near:    Bilateral Near:     Physical Exam Vitals reviewed.  Constitutional:      General: She is not in acute distress.    Appearance: Normal appearance. She is not ill-appearing.  HENT:     Head: Normocephalic and atraumatic.     Mouth/Throat:     Mouth: Mucous membranes are moist.     Comments: Moist mucous membranes Eyes:     Extraocular Movements: Extraocular movements intact.     Pupils: Pupils are equal, round, and reactive to light.  Cardiovascular:     Rate and Rhythm: Normal rate and regular rhythm.     Heart sounds: Normal heart sounds.  Pulmonary:     Effort: Pulmonary effort is normal.     Breath sounds: Normal breath sounds. No wheezing, rhonchi or rales.  Abdominal:     General: Bowel sounds are normal. There is no distension.     Palpations: Abdomen is soft. There is no mass.     Tenderness: There is no abdominal tenderness. There is no right CVA tenderness, left CVA tenderness, guarding or rebound. Negative signs include Murphy's sign, Rovsing's sign and McBurney's sign.     Comments: Abd pain is not reproducible  Skin:    General: Skin is warm.     Capillary Refill: Capillary refill takes less than 2 seconds.     Comments: Good skin turgor  Neurological:     General: No focal deficit present.     Mental Status: She is alert and oriented to person, place, and time.  Psychiatric:        Mood and Affect: Mood normal.        Behavior: Behavior normal.     UC Treatments / Results  Labs (all labs ordered are listed, but only abnormal results are displayed) Labs Reviewed  POCT  URINALYSIS DIP (MANUAL ENTRY) - Abnormal; Notable for the following components:      Result Value   Blood, UA trace-intact (*)    All other components within normal limits    EKG   Radiology No results found.  Procedures Procedures (including critical care time)  Medications Ordered in UC Medications - No  data to display  Initial Impression / Assessment and Plan / UC Course  I have reviewed the triage vital signs and the nursing notes.  Pertinent labs & imaging results that were available during my care of the patient were reviewed by me and considered in my medical decision making (see chart for details).     This patient is a very pleasant 60 y.o. year old female presenting with suspected nephrolithiasis. No reproducible abd pain or CVAT. Afebrile, nontachy.   UA with trace blood, otherwise wnl. Did not send culture.  Postmenopausal.   Will treat for nephrolithiasis with flomax and good hydration.  Strict ED return precautions discussed. Patient verbalizes understanding and agreement.   Final Clinical Impressions(s) / UC Diagnoses   Final diagnoses:  LUQ pain  Benign essential microscopic hematuria     Discharge Instructions      -I think that you have a kidney stone, based on your exam and urine results.  -To help this pass, drink lots of water! You can also start the Tamsulosin (Flomax)- one pill daily while symptoms persist.  -Tylenol and ibuprofen for pain -Rarely, kidney stones can be so big that it does not pass on its own.  If this happens, you will develop worsening abdominal pain, new back pain, new fever/chills, etc.  Head to the emergency department for further evaluation and management of that happens.     ED Prescriptions     Medication Sig Dispense Auth. Provider   tamsulosin (FLOMAX) 0.4 MG CAPS capsule Take 1 capsule (0.4 mg total) by mouth daily after breakfast for 7 days. One pill daily with breakfast or lunch while symptoms persist 7  capsule Rhys Martini, PA-C      PDMP not reviewed this encounter.   Rhys Martini, PA-C 07/27/20 1247

## 2020-10-07 ENCOUNTER — Encounter: Payer: Self-pay | Admitting: Internal Medicine

## 2021-01-19 ENCOUNTER — Encounter: Payer: BC Managed Care – PPO | Admitting: Internal Medicine

## 2021-02-02 DIAGNOSIS — R7303 Prediabetes: Secondary | ICD-10-CM | POA: Diagnosis not present

## 2021-02-02 DIAGNOSIS — R718 Other abnormality of red blood cells: Secondary | ICD-10-CM | POA: Diagnosis not present

## 2021-02-02 DIAGNOSIS — Z13220 Encounter for screening for lipoid disorders: Secondary | ICD-10-CM | POA: Diagnosis not present

## 2021-02-02 DIAGNOSIS — Z0000 Encounter for general adult medical examination without abnormal findings: Secondary | ICD-10-CM | POA: Diagnosis not present

## 2021-02-02 DIAGNOSIS — E559 Vitamin D deficiency, unspecified: Secondary | ICD-10-CM | POA: Diagnosis not present

## 2021-06-03 DIAGNOSIS — E1169 Type 2 diabetes mellitus with other specified complication: Secondary | ICD-10-CM | POA: Diagnosis not present

## 2021-06-03 DIAGNOSIS — Z23 Encounter for immunization: Secondary | ICD-10-CM | POA: Diagnosis not present

## 2021-07-09 LAB — GLUCOSE, POCT (MANUAL RESULT ENTRY): POC Glucose: 155 mg/dl — AB (ref 70–99)

## 2021-08-29 DIAGNOSIS — M1611 Unilateral primary osteoarthritis, right hip: Secondary | ICD-10-CM | POA: Diagnosis not present

## 2021-09-27 DIAGNOSIS — Z1231 Encounter for screening mammogram for malignant neoplasm of breast: Secondary | ICD-10-CM | POA: Diagnosis not present

## 2021-09-27 DIAGNOSIS — Z01419 Encounter for gynecological examination (general) (routine) without abnormal findings: Secondary | ICD-10-CM | POA: Diagnosis not present

## 2021-11-18 ENCOUNTER — Encounter: Payer: Self-pay | Admitting: *Deleted

## 2021-11-18 NOTE — Progress Notes (Signed)
Chart review indicated that pt seeing Dr. Aliene Beams - unable to see OV notes from her office in [facility] so called office to confirm. Pt hd A1C at office in June of 6.6, same as event result, and has kept regular appt with Dr. Tracie Harrier - next appt for CPX is 02/09/22. No further health equity support indicated at this time.

## 2021-12-21 DIAGNOSIS — E11319 Type 2 diabetes mellitus with unspecified diabetic retinopathy without macular edema: Secondary | ICD-10-CM | POA: Diagnosis not present

## 2022-02-09 DIAGNOSIS — Z13220 Encounter for screening for lipoid disorders: Secondary | ICD-10-CM | POA: Diagnosis not present

## 2022-02-09 DIAGNOSIS — E1169 Type 2 diabetes mellitus with other specified complication: Secondary | ICD-10-CM | POA: Diagnosis not present

## 2022-02-09 DIAGNOSIS — Z0000 Encounter for general adult medical examination without abnormal findings: Secondary | ICD-10-CM | POA: Diagnosis not present

## 2022-02-19 DIAGNOSIS — U071 COVID-19: Secondary | ICD-10-CM | POA: Diagnosis not present

## 2022-02-19 DIAGNOSIS — Z6827 Body mass index (BMI) 27.0-27.9, adult: Secondary | ICD-10-CM | POA: Diagnosis not present

## 2022-02-19 DIAGNOSIS — J029 Acute pharyngitis, unspecified: Secondary | ICD-10-CM | POA: Diagnosis not present

## 2022-03-07 DIAGNOSIS — H3563 Retinal hemorrhage, bilateral: Secondary | ICD-10-CM | POA: Diagnosis not present

## 2022-08-08 DIAGNOSIS — D649 Anemia, unspecified: Secondary | ICD-10-CM | POA: Diagnosis not present

## 2022-08-08 DIAGNOSIS — E119 Type 2 diabetes mellitus without complications: Secondary | ICD-10-CM | POA: Diagnosis not present

## 2022-10-09 DIAGNOSIS — Z01419 Encounter for gynecological examination (general) (routine) without abnormal findings: Secondary | ICD-10-CM | POA: Diagnosis not present

## 2022-10-09 DIAGNOSIS — Z1231 Encounter for screening mammogram for malignant neoplasm of breast: Secondary | ICD-10-CM | POA: Diagnosis not present

## 2023-02-13 NOTE — Progress Notes (Signed)
  Subjective Patient ID: Linda Wagner is a 63 y.o. female.  Chief Complaint  Patient presents with  . Ankle Pain    Patient fell off of ladder yesterday while cleaning gutters. Patient fell apprx 3 feet, on left ankle. Is using crutches to navigate environment, is unable to put weight on left foot. No Tylenol or Ibuprofen used this morning. Patient is not on blood thinners.    The following information was reviewed by members of the visit team:  Tobacco  Allergies  Meds  Problems  Med Hx  Surg Hx  OB Status   Fam Hx      Pt is a 63 yo here for left ankle pain, fell off ladder yesterday, has been elevating and putting ice on area; has pain/swelling/tenderness along lateral side, ROM and neurovascular status intact; denies head injury or other injuries, stable and in NAD   Ankle Pain     Review of Systems  Musculoskeletal:  Positive for arthralgias and myalgias.  All other systems reviewed and are negative.   Objective Physical Exam Vitals and nursing note reviewed.  Constitutional:      General: She is not in acute distress.    Appearance: She is normal weight.  HENT:     Head: Normocephalic.  Musculoskeletal:        General: Swelling, tenderness and signs of injury present. Normal range of motion.     Comments: LEFT LATERAL ANKLE   Skin:    General: Skin is warm and dry.     Capillary Refill: Capillary refill takes less than 2 seconds.  Neurological:     General: No focal deficit present.     Mental Status: She is alert and oriented to person, place, and time.  Psychiatric:        Mood and Affect: Mood normal.        Behavior: Behavior normal.     Assessment/Plan Diagnoses and all orders for this visit:  Sprain of left ankle, unspecified ligament, initial encounter -     XR Ankle Minimum 3 Views Left -     Apply Ankle Stirrup Splint   DDX: tendonitis, osteoarthritis, fracture, dislocation, sprain, strain  MDM: Pt is a 63 yo here for left ankle  pain, xray WNL; likely sprain, pain with weight bearing so will put in a stirrup brace, she has crutches to use; advised weight bearing as tolerated, RICE, f/u with Ortho in one week if not improving, sooner for worsening symptoms; pt verbalized understanding, agreeable to  plan of care, stable at departure.   Electronically signed: Rexene Charlies Pouch, NP 02/13/2023  8:40 AM

## 2023-08-07 NOTE — Progress Notes (Unsigned)
  Electrophysiology Office Note:    Date:  08/08/2023   ID:  Linda Wagner, DOB 06-05-1960, MRN [id number]  PCP:  Rolinda Millman, MD   [facility] Providers Cardiologist:  None     Referring MD: Rolinda Millman, MD   History of Present Illness:    Linda Wagner is a 63 y.o. female with little relative past medical history, referred for evaluation of bradycardia.      Discussed the use of AI scribe software for clinical note transcription with the patient, who gave verbal consent to proceed.  History of Present Illness Linda Wagner is a 63 year old female who presents with concern for bradycardia.  Her heart rate is typically in the low 40s, with an EKG showing approximately 40 beats per minute. On the day of her appointment, her heart rate was 38, though it usually ranges from 48 to 50 beats per minute. She denies dizziness, fainting, or other symptoms associated with bradycardia and feels well without any changes in her condition. EKG from October 17, 2018 shows a heart rate of 51 bpm  She recently started a high-intensity interval training class twice a week, including circuit training with kettlebells, and walks twice a week for about seven miles each day. She does not monitor her heart rate during these activities but has a watch capable of tracking it. No dizziness, fainting, or lightheadedness during these activities.         Today, she reports that she feels well and has no complaints.  EKGs/Labs/Other Studies Reviewed Today:      EKG:   EKG Interpretation Date/Time:  Wednesday August 08 2023 14:43:21 EDT Ventricular Rate:  47 PR Interval:  178 QRS Duration:  82 QT Interval:  444 QTC Calculation: 392 R Axis:   84  Text Interpretation: Sinus bradycardia No previous ECGs available Confirmed by Nancey Scotts [phone]) on 08/08/2023 3:11:30 PM     Physical Exam:    VS:  BP 120/84 (BP Location: Left Arm, Patient Position: Sitting, Cuff Size:  Normal)   Pulse (!) 47   Ht 5' 2.2 (1.58 m)   Wt 149 lb (67.6 kg)   LMP 07/09/2013 (Approximate) Comment: post menop  SpO2 98%   BMI 27.08 kg/m     Wt Readings from Last 3 Encounters:  08/08/23 149 lb (67.6 kg)  01/07/20 154 lb 6.4 oz (70 kg)  12/16/18 151 lb (68.5 kg)     GEN:  Well nourished, well developed in no acute distress CARDIAC: regular, brady heart rate, no murmurs, rubs, gallops RESPIRATORY:  Normal work of breathing MUSCULOSKELETAL: no edema    ASSESSMENT & PLAN:     Sinus bradycardia Heart rates as low as 38 bpm at rest She denies any symptoms of bradycardia such as lightheadedness, dizziness, fatigue She is participating in vigorous high intensity interval training without any limitation She rarely walks, sometimes 7 miles at a time I suspect her sinus bradycardia is physiologic, and in the absence of symptoms or pathology would not consider pacemaker placement  She will follow-up in 1 year.  I instructed her to call us  if she develops symptoms such as sudden onset fatigue, lightheadedness, dizziness passing out or the feeling that she is about to pass out    Signed, Scotts FORBES Nancey, MD  08/08/2023 3:12 PM    [facility]

## 2023-08-08 ENCOUNTER — Ambulatory Visit: Attending: Cardiovascular Disease | Admitting: Cardiovascular Disease

## 2023-08-08 ENCOUNTER — Encounter: Payer: Self-pay | Admitting: Cardiovascular Disease

## 2023-08-08 VITALS — BP 120/84 | HR 47 | Ht 62.2 in | Wt 149.0 lb

## 2023-08-08 DIAGNOSIS — R001 Bradycardia, unspecified: Secondary | ICD-10-CM

## 2023-08-08 NOTE — Patient Instructions (Signed)
 Medication Instructions:  Your physician recommends that you continue on your current medications as directed. Please refer to the Current Medication list given to you today.  *If you need a refill on your cardiac medications before your next appointment, please call your pharmacy*  Lab Work: None ordered.  If you have labs (blood work) drawn today and your tests are completely normal, you will receive your results only by: MyChart Message (if you have MyChart) OR A paper copy in the mail If you have any lab test that is abnormal or we need to change your treatment, we will call you to review the results.  Testing/Procedures: None ordered.   Follow-Up: At [facility], you and your health needs are our priority.  As part of our continuing mission to provide you with exceptional heart care, our providers are all part of one team.  This team includes your primary Cardiologist (physician) and Advanced Practice Providers or APPs (Physician Assistants and Nurse Practitioners) who all work together to provide you with the care you need, when you need it.  Your next appointment:   12 months with Dr Marko PA-C

## 2023-10-23 ENCOUNTER — Other Ambulatory Visit (HOSPITAL_BASED_OUTPATIENT_CLINIC_OR_DEPARTMENT_OTHER): Payer: Self-pay | Admitting: Obstetrics and Gynecology

## 2023-10-23 DIAGNOSIS — E2839 Other primary ovarian failure: Secondary | ICD-10-CM
